# Patient Record
Sex: Female | Born: 1980 | Race: Black or African American | Hispanic: No | Marital: Single | Smoking: Current every day smoker
Health system: Southern US, Community
[De-identification: ages and names within clinical notes are randomized; demographics above are authoritative.]

## PROBLEM LIST (undated history)

## (undated) ENCOUNTER — Emergency Department (HOSPITAL_COMMUNITY): Admission: EM | Disposition: A | Discharge status: Home / Self Care | Payer: Medicaid Other

## (undated) DIAGNOSIS — T4145XA Adverse effect of unspecified anesthetic, initial encounter: Secondary | ICD-10-CM

## (undated) DIAGNOSIS — F32A Depression, unspecified: Secondary | ICD-10-CM

## (undated) DIAGNOSIS — E7800 Pure hypercholesterolemia, unspecified: Secondary | ICD-10-CM

## (undated) DIAGNOSIS — F17200 Nicotine dependence, unspecified, uncomplicated: Secondary | ICD-10-CM

## (undated) DIAGNOSIS — G8929 Other chronic pain: Secondary | ICD-10-CM

## (undated) DIAGNOSIS — F329 Major depressive disorder, single episode, unspecified: Secondary | ICD-10-CM

## (undated) DIAGNOSIS — K219 Gastro-esophageal reflux disease without esophagitis: Secondary | ICD-10-CM

## (undated) DIAGNOSIS — T8859XA Other complications of anesthesia, initial encounter: Secondary | ICD-10-CM

## (undated) DIAGNOSIS — M542 Cervicalgia: Secondary | ICD-10-CM

## (undated) DIAGNOSIS — M549 Dorsalgia, unspecified: Principal | ICD-10-CM

## (undated) DIAGNOSIS — E669 Obesity, unspecified: Secondary | ICD-10-CM

## (undated) DIAGNOSIS — A749 Chlamydial infection, unspecified: Secondary | ICD-10-CM

## (undated) DIAGNOSIS — F1910 Other psychoactive substance abuse, uncomplicated: Secondary | ICD-10-CM

## (undated) DIAGNOSIS — K8020 Calculus of gallbladder without cholecystitis without obstruction: Secondary | ICD-10-CM

## (undated) HISTORY — DX: Nicotine dependence, unspecified, uncomplicated: F17.200

## (undated) HISTORY — DX: Pure hypercholesterolemia, unspecified: E78.00

## (undated) HISTORY — DX: Other chronic pain: G89.29

## (undated) HISTORY — DX: Dorsalgia, unspecified: M54.9

## (undated) HISTORY — DX: Morbid (severe) obesity due to excess calories: E66.01

## (undated) HISTORY — DX: Other psychoactive substance abuse, uncomplicated: F19.10

## (undated) HISTORY — DX: Obesity, unspecified: E66.9

## (undated) HISTORY — PX: OTHER SURGICAL HISTORY: SHX169

## (undated) HISTORY — DX: Chlamydial infection, unspecified: A74.9

## (undated) HISTORY — DX: Gastro-esophageal reflux disease without esophagitis: K21.9

## (undated) HISTORY — DX: Depression, unspecified: F32.A

## (undated) HISTORY — PX: TUBAL LIGATION: SHX77

---
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
## 2000-01-13 ENCOUNTER — Inpatient Hospital Stay (HOSPITAL_COMMUNITY): Admission: AD | Admit: 2000-01-13 | Discharge: 2000-01-13 | Payer: Self-pay | Admitting: *Deleted

## 2000-01-13 ENCOUNTER — Encounter: Payer: Self-pay | Admitting: Obstetrics

## 2001-08-01 ENCOUNTER — Inpatient Hospital Stay (HOSPITAL_COMMUNITY): Admission: AD | Admit: 2001-08-01 | Discharge: 2001-08-01 | Payer: Self-pay | Admitting: *Deleted

## 2001-08-02 ENCOUNTER — Encounter: Payer: Self-pay | Admitting: *Deleted

## 2001-08-02 ENCOUNTER — Inpatient Hospital Stay (HOSPITAL_COMMUNITY): Admission: AD | Admit: 2001-08-02 | Discharge: 2001-08-02 | Payer: Self-pay | Admitting: Obstetrics and Gynecology

## 2001-08-02 ENCOUNTER — Observation Stay (HOSPITAL_COMMUNITY): Admission: AD | Admit: 2001-08-02 | Discharge: 2001-08-03 | Payer: Self-pay | Admitting: Obstetrics and Gynecology

## 2001-08-15 ENCOUNTER — Encounter (HOSPITAL_COMMUNITY): Admission: AD | Admit: 2001-08-15 | Discharge: 2001-08-15 | Payer: Self-pay | Admitting: *Deleted

## 2001-08-23 ENCOUNTER — Inpatient Hospital Stay (HOSPITAL_COMMUNITY): Admission: AD | Admit: 2001-08-23 | Discharge: 2001-08-23 | Payer: Self-pay | Admitting: Obstetrics and Gynecology

## 2001-08-28 ENCOUNTER — Inpatient Hospital Stay (HOSPITAL_COMMUNITY): Admission: AD | Admit: 2001-08-28 | Discharge: 2001-08-29 | Payer: Self-pay | Admitting: Obstetrics and Gynecology

## 2003-06-28 ENCOUNTER — Emergency Department (HOSPITAL_COMMUNITY): Admission: EM | Admit: 2003-06-28 | Discharge: 2003-06-28 | Payer: Self-pay | Admitting: Emergency Medicine

## 2003-10-25 ENCOUNTER — Emergency Department (HOSPITAL_COMMUNITY): Admission: EM | Admit: 2003-10-25 | Discharge: 2003-10-25 | Payer: Self-pay

## 2004-03-15 ENCOUNTER — Emergency Department (HOSPITAL_COMMUNITY): Admission: EM | Admit: 2004-03-15 | Discharge: 2004-03-15 | Payer: Self-pay

## 2004-07-20 ENCOUNTER — Ambulatory Visit: Payer: Self-pay | Admitting: Obstetrics and Gynecology

## 2004-07-20 ENCOUNTER — Inpatient Hospital Stay (HOSPITAL_COMMUNITY): Admission: AD | Admit: 2004-07-20 | Discharge: 2004-07-23 | Payer: Self-pay | Admitting: Family Medicine

## 2004-07-22 ENCOUNTER — Encounter (INDEPENDENT_AMBULATORY_CARE_PROVIDER_SITE_OTHER): Payer: Self-pay | Admitting: *Deleted

## 2004-07-29 ENCOUNTER — Inpatient Hospital Stay (HOSPITAL_COMMUNITY): Admission: AD | Admit: 2004-07-29 | Discharge: 2004-07-29 | Payer: Self-pay | Admitting: Family Medicine

## 2006-08-04 ENCOUNTER — Emergency Department (HOSPITAL_COMMUNITY): Admission: EM | Admit: 2006-08-04 | Discharge: 2006-08-04 | Payer: Self-pay | Admitting: Emergency Medicine

## 2006-08-27 ENCOUNTER — Emergency Department (HOSPITAL_COMMUNITY): Admission: EM | Admit: 2006-08-27 | Discharge: 2006-08-27 | Payer: Self-pay | Admitting: Family Medicine

## 2007-06-19 ENCOUNTER — Emergency Department (HOSPITAL_COMMUNITY): Admission: EM | Admit: 2007-06-19 | Discharge: 2007-06-19 | Payer: Self-pay | Admitting: Emergency Medicine

## 2007-06-20 ENCOUNTER — Emergency Department (HOSPITAL_COMMUNITY): Admission: EM | Admit: 2007-06-20 | Discharge: 2007-06-20 | Payer: Self-pay | Admitting: Emergency Medicine

## 2007-06-21 ENCOUNTER — Emergency Department (HOSPITAL_COMMUNITY): Admission: EM | Admit: 2007-06-21 | Discharge: 2007-06-21 | Payer: Self-pay | Admitting: Family Medicine

## 2007-11-22 ENCOUNTER — Emergency Department (HOSPITAL_COMMUNITY): Admission: EM | Admit: 2007-11-22 | Discharge: 2007-11-22 | Payer: Self-pay | Admitting: Emergency Medicine

## 2009-02-23 ENCOUNTER — Emergency Department (HOSPITAL_COMMUNITY): Admission: EM | Admit: 2009-02-23 | Discharge: 2009-02-23 | Payer: Self-pay | Admitting: Emergency Medicine

## 2009-04-12 ENCOUNTER — Emergency Department (HOSPITAL_COMMUNITY): Admission: EM | Admit: 2009-04-12 | Discharge: 2009-04-13 | Payer: Self-pay | Admitting: Emergency Medicine

## 2009-12-01 ENCOUNTER — Emergency Department (HOSPITAL_COMMUNITY): Admission: EM | Admit: 2009-12-01 | Discharge: 2009-12-01 | Payer: Self-pay | Admitting: Family Medicine

## 2009-12-01 ENCOUNTER — Inpatient Hospital Stay (HOSPITAL_COMMUNITY): Admission: AD | Admit: 2009-12-01 | Discharge: 2009-12-01 | Payer: Self-pay | Admitting: Obstetrics and Gynecology

## 2010-02-26 ENCOUNTER — Inpatient Hospital Stay (HOSPITAL_COMMUNITY): Admission: AD | Admit: 2010-02-26 | Discharge: 2010-02-27 | Payer: Self-pay | Admitting: Obstetrics and Gynecology

## 2010-03-31 ENCOUNTER — Inpatient Hospital Stay (HOSPITAL_COMMUNITY)
Admission: AD | Admit: 2010-03-31 | Discharge: 2010-03-31 | Payer: Self-pay | Source: Home / Self Care | Attending: Obstetrics & Gynecology | Admitting: Obstetrics & Gynecology

## 2010-05-05 ENCOUNTER — Encounter: Payer: Self-pay | Admitting: Obstetrics & Gynecology

## 2010-05-05 DIAGNOSIS — O0930 Supervision of pregnancy with insufficient antenatal care, unspecified trimester: Secondary | ICD-10-CM

## 2010-05-05 DIAGNOSIS — O99330 Smoking (tobacco) complicating pregnancy, unspecified trimester: Secondary | ICD-10-CM

## 2010-05-05 DIAGNOSIS — O358XX0 Maternal care for other (suspected) fetal abnormality and damage, not applicable or unspecified: Secondary | ICD-10-CM

## 2010-05-05 LAB — CONVERTED CEMR LAB
Antibody Screen: NEGATIVE
Basophils Absolute: 0 10*3/uL (ref 0.0–0.1)
Basophils Relative: 0 % (ref 0–1)
Eosinophils Absolute: 0.2 10*3/uL (ref 0.0–0.7)
Eosinophils Relative: 2 % (ref 0–5)
HCT: 37 % (ref 36.0–46.0)
HIV: NONREACTIVE
Hemoglobin: 12.8 g/dL (ref 12.0–15.0)
Hepatitis B Surface Ag: NEGATIVE
Lymphocytes Relative: 21 % (ref 12–46)
Lymphs Abs: 2 10*3/uL (ref 0.7–4.0)
MCHC: 34.6 g/dL (ref 30.0–36.0)
MCV: 90.9 fL (ref 78.0–100.0)
Monocytes Absolute: 0.6 10*3/uL (ref 0.1–1.0)
Monocytes Relative: 6 % (ref 3–12)
Neutro Abs: 6.6 10*3/uL (ref 1.7–7.7)
Neutrophils Relative %: 70 % (ref 43–77)
Platelets: 313 10*3/uL (ref 150–400)
RBC: 4.07 M/uL (ref 3.87–5.11)
RDW: 13.9 % (ref 11.5–15.5)
Rh Type: POSITIVE
Rubella: 28.2 intl units/mL — ABNORMAL HIGH
WBC: 9.4 10*3/uL (ref 4.0–10.5)

## 2010-05-06 ENCOUNTER — Other Ambulatory Visit: Payer: Self-pay | Admitting: Obstetrics and Gynecology

## 2010-05-06 ENCOUNTER — Other Ambulatory Visit: Payer: Self-pay

## 2010-05-06 DIAGNOSIS — O0930 Supervision of pregnancy with insufficient antenatal care, unspecified trimester: Secondary | ICD-10-CM

## 2010-05-06 DIAGNOSIS — O358XX0 Maternal care for other (suspected) fetal abnormality and damage, not applicable or unspecified: Secondary | ICD-10-CM

## 2010-05-06 LAB — POCT URINALYSIS DIPSTICK
Ketones, ur: NEGATIVE mg/dL
Nitrite: NEGATIVE
Protein, ur: NEGATIVE mg/dL
Specific Gravity, Urine: >=1.03 (ref 1.005–1.030)
Urine Glucose, Fasting: NEGATIVE mg/dL
Urobilinogen, UA: 2 mg/dL — ABNORMAL HIGH (ref 0.0–1.0)
pH: 6 (ref 5.0–8.0)

## 2010-06-03 ENCOUNTER — Encounter: Payer: Self-pay | Admitting: Family

## 2010-06-03 ENCOUNTER — Other Ambulatory Visit: Payer: Self-pay

## 2010-06-03 ENCOUNTER — Encounter: Payer: Self-pay | Admitting: Obstetrics and Gynecology

## 2010-06-03 DIAGNOSIS — O99330 Smoking (tobacco) complicating pregnancy, unspecified trimester: Secondary | ICD-10-CM

## 2010-06-03 DIAGNOSIS — O0930 Supervision of pregnancy with insufficient antenatal care, unspecified trimester: Secondary | ICD-10-CM

## 2010-06-03 LAB — POCT URINALYSIS DIPSTICK
Bilirubin Urine: NEGATIVE
Glucose, UA: NEGATIVE mg/dL
Ketones, ur: NEGATIVE mg/dL
Nitrite: NEGATIVE
Protein, ur: NEGATIVE mg/dL
Specific Gravity, Urine: 1.02 (ref 1.005–1.030)
Urobilinogen, UA: 0.2 mg/dL (ref 0.0–1.0)
pH: 6.5 (ref 5.0–8.0)

## 2010-06-08 LAB — CBC
HCT: 36.2 % (ref 36.0–46.0)
Hemoglobin: 12.4 g/dL (ref 12.0–15.0)
MCH: 29.9 pg (ref 26.0–34.0)
MCHC: 34.3 g/dL (ref 30.0–36.0)
MCV: 87.2 fL (ref 78.0–100.0)
Platelets: 237 10*3/uL (ref 150–400)
RBC: 4.15 MIL/uL (ref 3.87–5.11)
RDW: 13.6 % (ref 11.5–15.5)
WBC: 10 10*3/uL (ref 4.0–10.5)

## 2010-06-08 LAB — DIFFERENTIAL
Basophils Absolute: 0 10*3/uL (ref 0.0–0.1)
Basophils Relative: 0 % (ref 0–1)
Eosinophils Absolute: 0.2 10*3/uL (ref 0.0–0.7)
Eosinophils Relative: 2 % (ref 0–5)
Lymphocytes Relative: 20 % (ref 12–46)
Lymphs Abs: 2 10*3/uL (ref 0.7–4.0)
Monocytes Absolute: 0.6 10*3/uL (ref 0.1–1.0)
Monocytes Relative: 6 % (ref 3–12)
Neutro Abs: 7.1 10*3/uL (ref 1.7–7.7)
Neutrophils Relative %: 72 % (ref 43–77)

## 2010-06-08 LAB — URINALYSIS, ROUTINE W REFLEX MICROSCOPIC
Bilirubin Urine: NEGATIVE
Bilirubin Urine: NEGATIVE
Glucose, UA: NEGATIVE mg/dL
Glucose, UA: NEGATIVE mg/dL
Hgb urine dipstick: NEGATIVE
Hgb urine dipstick: NEGATIVE
Ketones, ur: 15 mg/dL — AB
Ketones, ur: 15 mg/dL — AB
Nitrite: NEGATIVE
Nitrite: NEGATIVE
Protein, ur: NEGATIVE mg/dL
Protein, ur: NEGATIVE mg/dL
Specific Gravity, Urine: >1.03 — ABNORMAL HIGH (ref 1.005–1.030)
Specific Gravity, Urine: >1.03 — ABNORMAL HIGH (ref 1.005–1.030)
Urobilinogen, UA: 1 mg/dL (ref 0.0–1.0)
Urobilinogen, UA: 2 mg/dL — ABNORMAL HIGH (ref 0.0–1.0)
pH: 6 (ref 5.0–8.0)
pH: 6 (ref 5.0–8.0)

## 2010-06-08 LAB — URINE MICROSCOPIC-ADD ON

## 2010-06-08 LAB — WET PREP, GENITAL
Trich, Wet Prep: NONE SEEN
Yeast Wet Prep HPF POC: NONE SEEN

## 2010-06-08 LAB — RPR: RPR Ser Ql: NONREACTIVE

## 2010-06-08 LAB — RAPID URINE DRUG SCREEN, HOSP PERFORMED
Amphetamines: NOT DETECTED
Barbiturates: NOT DETECTED
Benzodiazepines: NOT DETECTED
Cocaine: NOT DETECTED
Opiates: NOT DETECTED
Tetrahydrocannabinol: POSITIVE — AB

## 2010-06-08 LAB — SICKLE CELL SCREEN: Sickle Cell Screen: NEGATIVE

## 2010-06-08 LAB — HIV ANTIBODY (ROUTINE TESTING W REFLEX): HIV: NONREACTIVE

## 2010-06-08 LAB — HEPATITIS B SURFACE ANTIGEN: Hepatitis B Surface Ag: NEGATIVE

## 2010-06-08 LAB — ABO/RH: ABO/RH(D): O POS

## 2010-06-08 LAB — RUBELLA SCREEN: Rubella: 28.7 IU/mL — ABNORMAL HIGH

## 2010-06-11 LAB — POCT URINALYSIS DIPSTICK
Protein, ur: 30 mg/dL — AB
Specific Gravity, Urine: >=1.03 (ref 1.005–1.030)
pH: 5.5 (ref 5.0–8.0)

## 2010-06-11 LAB — URINE CULTURE: Colony Count: 40000

## 2010-06-11 LAB — POCT I-STAT, CHEM 8
BUN: 4 mg/dL — ABNORMAL LOW (ref 6–23)
Creatinine, Ser: 0.5 mg/dL (ref 0.4–1.2)
Hemoglobin: 15 g/dL (ref 12.0–15.0)
Potassium: 3.7 mEq/L (ref 3.5–5.1)
Sodium: 137 mEq/L (ref 135–145)

## 2010-06-11 LAB — GC/CHLAMYDIA PROBE AMP, GENITAL: Chlamydia, DNA Probe: NEGATIVE

## 2010-06-11 LAB — WET PREP, GENITAL
WBC, Wet Prep HPF POC: NONE SEEN
Yeast Wet Prep HPF POC: NONE SEEN

## 2010-06-24 ENCOUNTER — Other Ambulatory Visit: Payer: Self-pay | Admitting: Obstetrics and Gynecology

## 2010-06-24 DIAGNOSIS — O0930 Supervision of pregnancy with insufficient antenatal care, unspecified trimester: Secondary | ICD-10-CM

## 2010-06-24 DIAGNOSIS — Z331 Pregnant state, incidental: Secondary | ICD-10-CM

## 2010-06-24 DIAGNOSIS — O358XX0 Maternal care for other (suspected) fetal abnormality and damage, not applicable or unspecified: Secondary | ICD-10-CM

## 2010-06-24 LAB — POCT URINALYSIS DIP (DEVICE)
Bilirubin Urine: NEGATIVE
Glucose, UA: NEGATIVE mg/dL
Ketones, ur: NEGATIVE mg/dL
pH: 6 (ref 5.0–8.0)

## 2010-07-06 ENCOUNTER — Other Ambulatory Visit: Payer: Self-pay | Admitting: Obstetrics and Gynecology

## 2010-07-06 DIAGNOSIS — O99330 Smoking (tobacco) complicating pregnancy, unspecified trimester: Secondary | ICD-10-CM

## 2010-07-06 DIAGNOSIS — O0930 Supervision of pregnancy with insufficient antenatal care, unspecified trimester: Secondary | ICD-10-CM

## 2010-07-06 LAB — POCT URINALYSIS DIP (DEVICE)
Ketones, ur: NEGATIVE mg/dL
Protein, ur: NEGATIVE mg/dL
Specific Gravity, Urine: >=1.03 (ref 1.005–1.030)

## 2010-07-08 ENCOUNTER — Inpatient Hospital Stay (HOSPITAL_COMMUNITY)
Admission: RE | Admit: 2010-07-08 | Discharge: 2010-07-09 | DRG: 775 | Disposition: A | Discharge status: Home / Self Care | Payer: Medicaid Other | Source: Ambulatory Visit | Attending: Obstetrics & Gynecology | Admitting: Obstetrics & Gynecology

## 2010-07-08 DIAGNOSIS — O480 Post-term pregnancy: Secondary | ICD-10-CM

## 2010-07-08 LAB — CBC
HCT: 37 % (ref 36.0–46.0)
Hemoglobin: 12.4 g/dL (ref 12.0–15.0)
MCV: 89.8 fL (ref 78.0–100.0)
RBC: 4.12 MIL/uL (ref 3.87–5.11)
RDW: 13.8 % (ref 11.5–15.5)
WBC: 8 10*3/uL (ref 4.0–10.5)

## 2010-08-14 NOTE — Op Note (Signed)
NAMETERICKA, DEVINCENZI                  ACCOUNT NO.:  [id number]   MEDICAL RECORD NO.:  [id number]          PATIENT TYPE:  INP   LOCATION:  9124                          FACILITY:  [facility]   PHYSICIAN:  Jon Gills, M.D.  DATE OF BIRTH:  1980/04/21   DATE OF PROCEDURE:  07/22/2004  DATE OF DISCHARGE:                                 OPERATIVE REPORT   PREOPERATIVE DIAGNOSIS:  1.  Multiparity.  2.  Desired sterilization.   POSTOPERATIVE DIAGNOSIS:  1.  Multiparity.  2.  Desired sterilization.   PROCEDURE:  Postpartum tubal ligation, Pomeroy method.   SURGEON:  Javier Glazier. Okey Dupre, M.D.   ASSISTANT:  Jon Gills, M.D.   ANESTHESIA:  Epidural, 0.5% Marcaine for local anesthesia.   COMPLICATIONS:  None.   ESTIMATED BLOOD LOSS:  Less than 200 mL.   INDICATIONS FOR PROCEDURE:  A 30 year old, gravida 4, para 4, status post  normal spontaneous vaginal delivery who desires permanent sterilization.  The risks and benefits of the procedure were discussed with the patient  including a risk failure of less than 1% with increased risk of ectopic  pregnancy if pregnancy does occur.   FINDINGS:  Normal uterus, tubes, and ovaries.   DESCRIPTION OF PROCEDURE:  The patient was taken to the operating room where  her epidural was found to be adequate.  A small transverse infraumbilical  skin incision was made with a scalpel.  The incision was carried down to the  underlying layer of fascia until the peritoneum was identified and entered.  The peritoneum was noted to be free of any adhesions and the incision was  extended with Metzenbaum scissors.   The patient's right fallopian tube was identified, brought to the incision,  and grasped with Babcock clamp.  The tube was followed out to the fimbria.  A 3 cm segment of tube was ligated with a free tie of plain gut and excised.  Good hemostasis was noted.  The ends were cauterized and the tube was  returned to the abdomen.  The left fallopian tube was  ligated and a 3 cm  segment was excised in a similar fashion.  The tube was cauterized on the  ends, excellent hemostasis was noted, and the tube was returned to the  abdomen.   The peritoneum and fascia were closed in two layers using 0 Vicryl.  The  skin was closed in subcuticular fashion.   The patient tolerated the procedure well.  Needle, sponge, and instrument  counts correct x2.  The patient was taken to the recovery room in stable  condition.   PATHOLOGY:  Portions of right and left tube.      LC/MEDQ  D:  07/22/2004  T:  07/23/2004  Job:  [id number]

## 2010-08-19 ENCOUNTER — Ambulatory Visit: Payer: Medicaid Other | Admitting: Family Medicine

## 2010-08-19 ENCOUNTER — Ambulatory Visit: Payer: Medicaid Other | Admitting: Obstetrics & Gynecology

## 2010-08-28 ENCOUNTER — Other Ambulatory Visit: Payer: Self-pay | Admitting: Specialist

## 2010-08-28 ENCOUNTER — Ambulatory Visit
Admission: RE | Admit: 2010-08-28 | Discharge: 2010-08-28 | Disposition: A | Discharge status: Home / Self Care | Payer: Medicaid Other | Source: Ambulatory Visit | Attending: Specialist | Admitting: Specialist

## 2010-08-28 DIAGNOSIS — M542 Cervicalgia: Secondary | ICD-10-CM

## 2010-08-28 DIAGNOSIS — R200 Anesthesia of skin: Secondary | ICD-10-CM

## 2010-09-03 ENCOUNTER — Ambulatory Visit: Payer: Medicaid Other | Admitting: Physician Assistant

## 2010-09-25 ENCOUNTER — Ambulatory Visit (INDEPENDENT_AMBULATORY_CARE_PROVIDER_SITE_OTHER): Payer: Medicaid Other | Admitting: Physician Assistant

## 2010-10-06 ENCOUNTER — Ambulatory Visit: Payer: Medicaid Other | Attending: Family Medicine | Admitting: Physical Therapy

## 2010-10-29 ENCOUNTER — Ambulatory Visit: Payer: Medicaid Other | Attending: Family Medicine | Admitting: Rehabilitative and Restorative Service Providers"

## 2010-10-29 DIAGNOSIS — M2550 Pain in unspecified joint: Secondary | ICD-10-CM | POA: Insufficient documentation

## 2010-10-29 DIAGNOSIS — M2569 Stiffness of other specified joint, not elsewhere classified: Secondary | ICD-10-CM | POA: Insufficient documentation

## 2010-10-29 DIAGNOSIS — IMO0001 Reserved for inherently not codable concepts without codable children: Secondary | ICD-10-CM | POA: Insufficient documentation

## 2010-11-10 ENCOUNTER — Ambulatory Visit: Payer: Medicaid Other | Admitting: Rehabilitative and Restorative Service Providers"

## 2010-11-17 ENCOUNTER — Encounter: Payer: Medicaid Other | Admitting: Rehabilitative and Restorative Service Providers"

## 2010-11-20 ENCOUNTER — Ambulatory Visit: Payer: Medicaid Other | Admitting: Family Medicine

## 2011-01-04 ENCOUNTER — Encounter: Payer: Self-pay | Admitting: *Deleted

## 2011-01-04 DIAGNOSIS — M549 Dorsalgia, unspecified: Secondary | ICD-10-CM

## 2011-01-04 DIAGNOSIS — Z8619 Personal history of other infectious and parasitic diseases: Secondary | ICD-10-CM

## 2011-01-04 DIAGNOSIS — E669 Obesity, unspecified: Secondary | ICD-10-CM

## 2011-01-04 DIAGNOSIS — F1910 Other psychoactive substance abuse, uncomplicated: Secondary | ICD-10-CM

## 2011-01-04 DIAGNOSIS — G8929 Other chronic pain: Secondary | ICD-10-CM

## 2011-01-04 DIAGNOSIS — F17200 Nicotine dependence, unspecified, uncomplicated: Secondary | ICD-10-CM | POA: Insufficient documentation

## 2011-01-04 HISTORY — DX: Personal history of other infectious and parasitic diseases: Z86.19

## 2011-01-06 ENCOUNTER — Ambulatory Visit: Payer: Medicaid Other | Admitting: Family Medicine

## 2011-03-06 ENCOUNTER — Emergency Department (HOSPITAL_COMMUNITY)
Admission: EM | Admit: 2011-03-06 | Discharge: 2011-03-06 | Disposition: A | Discharge status: Home / Self Care | Payer: Medicaid Other | Attending: Emergency Medicine | Admitting: Emergency Medicine

## 2011-03-06 ENCOUNTER — Encounter (HOSPITAL_COMMUNITY): Payer: Self-pay | Admitting: *Deleted

## 2011-03-06 ENCOUNTER — Emergency Department (HOSPITAL_COMMUNITY): Payer: Medicaid Other

## 2011-03-06 DIAGNOSIS — S139XXA Sprain of joints and ligaments of unspecified parts of neck, initial encounter: Secondary | ICD-10-CM | POA: Insufficient documentation

## 2011-03-06 DIAGNOSIS — S161XXA Strain of muscle, fascia and tendon at neck level, initial encounter: Secondary | ICD-10-CM

## 2011-03-06 DIAGNOSIS — R51 Headache: Secondary | ICD-10-CM

## 2011-03-06 DIAGNOSIS — F17200 Nicotine dependence, unspecified, uncomplicated: Secondary | ICD-10-CM | POA: Insufficient documentation

## 2011-03-06 DIAGNOSIS — R109 Unspecified abdominal pain: Secondary | ICD-10-CM | POA: Insufficient documentation

## 2011-03-06 DIAGNOSIS — M542 Cervicalgia: Secondary | ICD-10-CM | POA: Insufficient documentation

## 2011-03-06 DIAGNOSIS — R6884 Jaw pain: Secondary | ICD-10-CM | POA: Insufficient documentation

## 2011-03-06 LAB — CBC
HCT: 38 % (ref 36.0–46.0)
Hemoglobin: 12.8 g/dL (ref 12.0–15.0)
MCH: 29 pg (ref 26.0–34.0)
MCV: 86 fL (ref 78.0–100.0)
RBC: 4.42 MIL/uL (ref 3.87–5.11)
WBC: 6.8 10*3/uL (ref 4.0–10.5)

## 2011-03-06 LAB — BASIC METABOLIC PANEL
BUN: 7 mg/dL (ref 6–23)
CO2: 25 mEq/L (ref 19–32)
Calcium: 9.4 mg/dL (ref 8.4–10.5)
Chloride: 105 mEq/L (ref 96–112)
Creatinine, Ser: 0.61 mg/dL (ref 0.50–1.10)
Glucose, Bld: 86 mg/dL (ref 70–99)

## 2011-03-06 MED ORDER — HYDROMORPHONE HCL 2 MG PO TABS: 2.0000 mg | ORAL_TABLET | ORAL | Status: AC | PRN

## 2011-03-06 MED ORDER — SODIUM CHLORIDE 0.9 % IV SOLN
INTRAVENOUS | Status: DC
  Administered 2011-03-06: 100 mL via INTRAVENOUS

## 2011-03-06 MED ORDER — HYDROMORPHONE HCL PF 1 MG/ML IJ SOLN
1.0000 mg | Freq: Once | INTRAMUSCULAR | Status: AC
  Administered 2011-03-06: 1 mg via INTRAVENOUS
  Filled 2011-03-06: qty 1

## 2011-03-06 MED ORDER — ONDANSETRON HCL 4 MG/2ML IJ SOLN
4.0000 mg | Freq: Once | INTRAMUSCULAR | Status: AC
  Administered 2011-03-06: 4 mg via INTRAVENOUS
  Filled 2011-03-06: qty 2

## 2011-03-06 MED ORDER — IOHEXOL 300 MG/ML  SOLN
125.0000 mL | Freq: Once | INTRAMUSCULAR | Status: AC | PRN
  Administered 2011-03-06: 125 mL via INTRAVENOUS

## 2011-03-06 MED ORDER — NAPROXEN 500 MG PO TABS: 500.0000 mg | ORAL_TABLET | Freq: Two times a day (BID) | ORAL | Status: DC

## 2011-03-06 MED ORDER — PROMETHAZINE HCL 25 MG PO TABS: 25.0000 mg | ORAL_TABLET | Freq: Four times a day (QID) | ORAL | Status: AC | PRN
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
## 2011-03-08 ENCOUNTER — Encounter (HOSPITAL_COMMUNITY): Payer: Self-pay | Admitting: Emergency Medicine

## 2011-03-08 ENCOUNTER — Emergency Department (HOSPITAL_COMMUNITY)
Admission: EM | Admit: 2011-03-08 | Discharge: 2011-03-08 | Disposition: A | Discharge status: Home / Self Care | Payer: Medicaid Other | Attending: Emergency Medicine | Admitting: Emergency Medicine

## 2011-03-08 DIAGNOSIS — M549 Dorsalgia, unspecified: Secondary | ICD-10-CM

## 2011-03-08 DIAGNOSIS — R599 Enlarged lymph nodes, unspecified: Secondary | ICD-10-CM | POA: Insufficient documentation

## 2011-03-08 DIAGNOSIS — R109 Unspecified abdominal pain: Secondary | ICD-10-CM | POA: Insufficient documentation

## 2011-03-08 LAB — URINALYSIS, ROUTINE W REFLEX MICROSCOPIC
Nitrite: NEGATIVE
Protein, ur: NEGATIVE mg/dL
Urobilinogen, UA: 1 mg/dL (ref 0.0–1.0)

## 2011-03-08 LAB — URINE MICROSCOPIC-ADD ON

## 2011-03-08 MED ORDER — IBUPROFEN 800 MG PO TABS: 800.0000 mg | ORAL_TABLET | Freq: Three times a day (TID) | ORAL | Status: AC

## 2011-03-08 MED ORDER — HYDROMORPHONE HCL PF 2 MG/ML IJ SOLN
2.0000 mg | Freq: Once | INTRAMUSCULAR | Status: AC
  Administered 2011-03-08: 2 mg via INTRAMUSCULAR
  Filled 2011-03-08: qty 1

## 2011-03-08 NOTE — ED Notes (Signed)
Pt states was seen Friday, states pain is worse, c/o abd and back pain. States medication is not helping.
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
## 2011-05-17 ENCOUNTER — Emergency Department (HOSPITAL_COMMUNITY)
Admission: EM | Admit: 2011-05-17 | Discharge: 2011-05-17 | Disposition: A | Discharge status: Home / Self Care | Payer: Medicaid Other | Source: Home / Self Care | Attending: Family Medicine | Admitting: Family Medicine

## 2011-05-17 ENCOUNTER — Encounter (HOSPITAL_COMMUNITY): Payer: Self-pay | Admitting: Cardiology

## 2011-05-17 DIAGNOSIS — J069 Acute upper respiratory infection, unspecified: Secondary | ICD-10-CM

## 2011-05-17 MED ORDER — TRAMADOL HCL 50 MG PO TABS: 50.0000 mg | ORAL_TABLET | Freq: Four times a day (QID) | ORAL | Status: AC | PRN

## 2011-05-17 MED ORDER — ALBUTEROL SULFATE HFA 108 (90 BASE) MCG/ACT IN AERS: 1.0000 | INHALATION_SPRAY | Freq: Four times a day (QID) | RESPIRATORY_TRACT | Status: DC | PRN

## 2011-05-17 MED ORDER — IBUPROFEN 600 MG PO TABS: 600.0000 mg | ORAL_TABLET | Freq: Three times a day (TID) | ORAL | Status: AC | PRN

## 2011-05-17 NOTE — Discharge Instructions (Signed)
You need to quit smoking! Is very important top keep well hydrated. Take the prescribed medications as instructed. Can take ibuprofen scheduled every 8 hours, take with food and plenty of liquids as it can upset your stomach, can alternate with tylenol every 6 hours as needed for pain or fever. Use nasal saline spray at least 3 times a day. (simply saline is over the counter) Return if difficulty breathing or not keeping fluids down.

## 2011-05-17 NOTE — ED Provider Notes (Signed)
History     CSN: [id number]  Arrival date & time 05/17/11  7829   First MD Initiated Contact with Patient 05/17/11 1905      Chief Complaint  Patient presents with  . Nasal Congestion  . Cough  . Sore Throat  . Back Pain    (Consider location/radiation/quality/duration/timing/severity/associated sxs/prior treatment) HPI Comments: 31 y/o female smoker. Here c/o nasal congestion, rhinorrhea, cough non productive and wheezing for 3 days. Ran out her albuterol.  No fever, no chest pain or shortness of breath.. Children with similar symptoms. Has body aches and exacerbation of her chronic back pain with coughing. Good appetite. No nausea vomiting or diarrhea.   Past Medical History  Diagnosis Date  . Chronic back pain   . Drug abuse     marijuana  . Chlamydia   . Smoker   . Obesity     Past Surgical History  Procedure Date  . Btl     No family history on file.  History  Substance Use Topics  . Smoking status: Current Everyday Smoker -- 0.2 packs/day    Types: Cigarettes  . Smokeless tobacco: Not on file  . Alcohol Use: No    OB History    Grav Para Term Preterm Abortions TAB SAB Ect Mult Living   5 5 5       5       Review of Systems  Constitutional: Negative for fever, chills and appetite change.  HENT: Positive for congestion, sore throat and rhinorrhea. Negative for trouble swallowing.   Respiratory: Positive for cough and wheezing. Negative for shortness of breath.   Gastrointestinal: Negative for nausea, vomiting and abdominal pain.  Musculoskeletal: Positive for back pain.  Skin: Negative for rash.    Allergies  Hydrocodone  Home Medications   Current Outpatient Rx  Name Route Sig Dispense Refill  . NAPROXEN 500 MG PO TABS Oral Take 1 tablet (500 mg total) by mouth 2 (two) times daily. 30 tablet 0  . ALBUTEROL SULFATE HFA 108 (90 BASE) MCG/ACT IN AERS Inhalation Inhale 1-2 puffs into the lungs every 6 (six) hours as needed for wheezing. 1 Inhaler 0   . IBUPROFEN 600 MG PO TABS Oral Take 1 tablet (600 mg total) by mouth every 8 (eight) hours as needed for pain. 20 tablet 0  . TRAMADOL HCL 50 MG PO TABS Oral Take 1 tablet (50 mg total) by mouth every 6 (six) hours as needed for pain. 15 tablet 0    BP 105/71  Pulse 71  Temp(Src) 98.5 F (36.9 C) (Oral)  Resp 18  SpO2 100%  LMP 05/17/2011  Breastfeeding? No  Physical Exam  Nursing note and vitals reviewed. Constitutional: She is oriented to person, place, and time. She appears well-developed and well-nourished. No distress.  HENT:       Nasal Congestion with erythema and swelling of nasal turbinates, clear rhinorrhea. Mild pharyngeal erythema no exudates. No uvula deviation. No trismus. TM's normal  Eyes: Conjunctivae are normal. Pupils are equal, round, and reactive to light.  Neck: No JVD present.  Cardiovascular: Normal rate, regular rhythm and normal heart sounds.   Pulmonary/Chest: Effort normal and breath sounds normal. No respiratory distress. She has no wheezes. She has no rales. She exhibits no tenderness.  Lymphadenopathy:    She has no cervical adenopathy.  Neurological: She is alert and oriented to person, place, and time.  Skin: No rash noted.    ED Course  Procedures (including critical care time)  Labs  Reviewed - No data to display No results found.   1. URI (upper respiratory infection)       MDM  Refilled albuterol plus symptomatic treatment advised.        Sharin Grave, MD 05/19/11 1433

## 2011-05-17 NOTE — ED Notes (Signed)
Pt report nasal congestion, sore throat, and cough that started yesterday. Denies fever. Pt also reports mid back pain going down right arm that started after having epidural for childbirth. Tolerating PO intake.

## 2011-07-19 ENCOUNTER — Inpatient Hospital Stay (HOSPITAL_COMMUNITY)
Admission: AD | Admit: 2011-07-19 | Discharge: 2011-07-20 | Disposition: A | Discharge status: Home / Self Care | Payer: Medicaid Other | Source: Ambulatory Visit | Attending: Emergency Medicine | Admitting: Emergency Medicine

## 2011-07-19 ENCOUNTER — Encounter (HOSPITAL_COMMUNITY): Payer: Self-pay | Admitting: *Deleted

## 2011-07-19 DIAGNOSIS — M549 Dorsalgia, unspecified: Secondary | ICD-10-CM | POA: Insufficient documentation

## 2011-07-19 DIAGNOSIS — R51 Headache: Secondary | ICD-10-CM

## 2011-07-19 DIAGNOSIS — G8929 Other chronic pain: Secondary | ICD-10-CM

## 2011-07-19 DIAGNOSIS — R29898 Other symptoms and signs involving the musculoskeletal system: Secondary | ICD-10-CM | POA: Insufficient documentation

## 2011-07-19 HISTORY — DX: Other complications of anesthesia, initial encounter: T88.59XA

## 2011-07-19 HISTORY — DX: Adverse effect of unspecified anesthetic, initial encounter: T41.45XA

## 2011-07-19 LAB — POCT PREGNANCY, URINE: Preg Test, Ur: NEGATIVE

## 2011-07-19 MED ORDER — METOCLOPRAMIDE HCL 5 MG/ML IJ SOLN
10.0000 mg | Freq: Once | INTRAMUSCULAR | Status: AC
  Administered 2011-07-19: 10 mg via INTRAVENOUS
  Filled 2011-07-19: qty 2

## 2011-07-19 MED ORDER — DIPHENHYDRAMINE HCL 50 MG/ML IJ SOLN
25.0000 mg | Freq: Once | INTRAMUSCULAR | Status: AC
  Administered 2011-07-19: 25 mg via INTRAVENOUS
  Filled 2011-07-19: qty 1

## 2011-07-19 MED ORDER — HYDROMORPHONE HCL PF 1 MG/ML IJ SOLN
1.0000 mg | Freq: Once | INTRAMUSCULAR | Status: AC
  Administered 2011-07-19: 1 mg via INTRAVENOUS
  Filled 2011-07-19: qty 1

## 2011-07-19 MED ORDER — LORAZEPAM 2 MG/ML IJ SOLN
1.0000 mg | Freq: Once | INTRAMUSCULAR | Status: AC
  Administered 2011-07-19: 1 mg via INTRAVENOUS
  Filled 2011-07-19: qty 1

## 2011-07-19 NOTE — MAU Provider Note (Signed)
History     CSN: [id number]  Arrival date and time: 07/19/11 1646   First Provider Initiated Contact with Patient 07/19/11 1734      Chief Complaint  Patient presents with  . Back, neck, arm and spine pain    HPI Vanessa Donaldson is 31 y.o. [id number] Unknown weeks presenting with back pain with numbness in her right arm.  States she dropped a cup in our bathroom from weakness in her right arm.   States she has been to "every hospital and all they given me is pain medication".  Back itches and hurts so she scratches until it bleeds.  Delivered here last April and thinks the back pain is secondary to epidural, never had back pain until then. States she has back pain daily.  Came here because she has been everywhere else but it continues. Had tubal ligation and then got pregnancy 6 years later because " they took out the wrong organs". She has been using her now deceased grandmother's medication for pain but is now out.  She was wearing patches for pain.  Last patch used last week.  States she can't hold her baby.  LMP 07/19/11--expected.  Uses condoms for contraception.  Dr. Mayford Knife on [address] is setting up appt for IUD.     Past Medical History  Diagnosis Date  . Chronic back pain   . Drug abuse     marijuana  . Chlamydia   . Smoker   . Obesity   . Complication of anesthesia     perceived problem with epidural    Past Surgical History  Procedure Date  . Btl   . Tubal ligation     had a baby since BTL    History reviewed. No pertinent family history.  History  Substance Use Topics  . Smoking status: Current Everyday Smoker -- 0.2 packs/day    Types: Cigarettes  . Smokeless tobacco: Never Used  . Alcohol Use: No    Allergies:  Allergies  Allergen Reactions  . Hydrocodone Nausea Only and Other (See Comments)    Migraine headaches    Prescriptions prior to admission  Medication Sig Dispense Refill  . ibuprofen (ADVIL,MOTRIN) 800 MG tablet Take 800 mg by mouth every  8 (eight) hours as needed. For pain.      . naproxen (NAPROSYN) 500 MG tablet Take 1 tablet (500 mg total) by mouth 2 (two) times daily.  30 tablet  0    Review of Systems  Musculoskeletal: Positive for back pain.  Neurological: Positive for focal weakness (right arm/hand). Negative for dizziness, tingling and tremors.       Negative for slurred speech or unsteady gait.   Physical Exam   Blood pressure 112/64, pulse 64, temperature 98.9 F (37.2 C), temperature source Oral, resp. rate 22, height 5\' 2"  (1.575 m), weight 113.399 kg (250 lb), last menstrual period 07/19/2011, SpO2 100.00%.  Physical Exam  Constitutional: She is oriented to person, place, and time. She appears well-developed and well-nourished. She appears distressed (uncomfortable).  HENT:  Head: Normocephalic.  Cardiovascular: Normal rate.   Respiratory: Effort normal.  Musculoskeletal: She exhibits tenderness (mid-lower back with palpation).  Neurological: She is alert and oriented to person, place, and time. Coordination normal.  Skin: Skin is warm and dry.  Psychiatric: She has a normal mood and affect.    MAU Course  Procedures  MDM 17:55 Reported patient's presenting sxs and concern to Dr. Jolayne Panther.  Will transfer to [facility] for further  evaluation of back pain and weakness in her right arm.            Reported to Dr. Margarita Grizzle at [facility], she accepted transfer for further evaluation.  Patient informed of plan of care and she accepts.  Assessment and Plan  A:  1 year hx of back pain     Weakness in right arm  P:  Transfer to [facility] ED by Sherlie Ban 07/19/2011, 5:43 PM

## 2011-07-19 NOTE — MAU Note (Signed)
Transferred to [facility] via [facility] 
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
## 2011-07-19 NOTE — MAU Note (Addendum)
Patient in BR crying with pain. Went to assist patient to room. Patient doubled over crying with C/O pain all over her back.States she has had pain up and down her back, right arm is weak. Sometimes numbness, sometimes sharp shooting, variable. States it "cracks and pops" sometimes. States she has had this since she had her baby 1 year ago. Thinks it is from the epidural.  States when she was in the bathroom on arrival, her arm was so weak, she dropped the cup and got her clothing all wet. States her grandmother died recently and there were some left over pain patches and oxycodone, so she was using them. States she doesn't usually do that, but she was desparate. States the patches and the pills didn't help anyway. States she has been seen multiple times and has been given different medications, but has not received any relief and has not been given a diagnosis. States muscle relaxer does not help either. States she saw a physical therapist 1 time just for discussion. States she knows something is wrong.

## 2011-07-19 NOTE — ED Notes (Signed)
[id number]<AV> Expected date:<BR> Expected time:<BR> Means of arrival:<BR> Comments:<BR> [facility], back pain

## 2011-07-20 MED ORDER — METOCLOPRAMIDE HCL 10 MG PO TABS: 10.0000 mg | ORAL_TABLET | Freq: Four times a day (QID) | ORAL | Status: DC | PRN

## 2011-07-20 MED ORDER — OXYCODONE-ACETAMINOPHEN 5-325 MG PO TABS: 2.0000 | ORAL_TABLET | Freq: Three times a day (TID) | ORAL | Status: AC | PRN

## 2011-07-20 NOTE — Discharge Instructions (Signed)
You are having a headache. No specific cause was found today for your headache. It may have been a migraine or other cause of headache. Stress, anxiety, fatigue, and depression are common triggers for headaches. Your headache today does not appear to be life-threatening or require hospitalization, but often the exact cause of headaches is not determined in the emergency department. Therefore, follow-up with your doctor is very important to find out what may have caused your headache, and whether or not you need any further diagnostic testing or treatment. Sometimes headaches can appear benign (not harmful), but then more serious symptoms can develop which should prompt an immediate re-evaluation by your doctor or the emergency department. SEEK MEDICAL ATTENTION IF: You develop possible problems with medications prescribed.  The medications don't resolve your headache, if it recurs , or if you have multiple episodes of vomiting or can't take fluids. You have a change from the usual headache. RETURN IMMEDIATELY IF you develop a sudden, severe headache or confusion, become poorly responsive or faint, develop a fever above 100.80F or problem breathing, have a change in speech, vision, swallowing, or understanding, or develop new weakness, numbness, tingling, incoordination, or have a seizure.  You have neck pain, possibly from a cervical strain and/or pinched nerve.  SEEK IMMEDIATE MEDICAL ATTENTION IF: You develop difficulties swallowing or breathing.  You have new or worse numbness, weakness, tingling, or movement problems in your arms or legs.  You develop increasing pain which is uncontrolled with medications.  You have change in bowel or bladder function, or other concerns.  SEEK IMMEDIATE MEDICAL ATTENTION IF: New numbness, tingling, weakness, or problem with the use of your arms or legs.  Severe back pain not relieved with medications.  Change in bowel or bladder control.  Increasing pain in any  areas of the body (such as chest or abdominal pain).  Shortness of breath, dizziness or fainting.  Nausea (feeling sick to your stomach), vomiting, fever, or sweats.  Chronic Pain Discharge Instructions  Emergency care providers appreciate that many patients coming to [place] are in severe pain and we wish to address their pain in the safest, most responsible manner.  It is important to recognize however, that the proper treatment of chronic pain differs from that of the pain of injuries and acute illnesses.  Our goal is to provide quality, safe, personalized care and we thank you for giving [place] the opportunity to serve you. The use of narcotics and related agents for chronic pain syndromes may lead to additional physical and psychological problems.  Nearly as many people die from prescription narcotics each year as die from car crashes.  Additionally, this risk is increased if such prescriptions are obtained from a variety of sources.  Therefore, only your primary care physician or a pain management specialist is able to safely treat such syndromes with narcotic medications long-term.    Documentation revealing such prescriptions have been sought from multiple sources may prohibit [place] from providing a refill or different narcotic medication.  Your name may be checked first through the [facility] Controlled Substances Reporting System.  This database is a record of controlled substance medication prescriptions that the patient has received.  This has been established by [facility] in an effort to eliminate the dangerous, and often life threatening, practice of obtaining multiple prescriptions from different medical providers.   If you have a chronic pain syndrome (i.e. chronic headaches, recurrent back or neck pain, dental pain, abdominal or pelvis pain without a specific diagnosis,  or neuropathic pain such as fibromyalgia) or recurrent visits for the same condition without an acute diagnosis, you may be  treated with non-narcotics and other non-addictive medicines.  Allergic reactions or negative side effects that may be reported by a patient to such medications will not typically lead to the use of a narcotic analgesic or other controlled substance as an alternative.   Patients managing chronic pain with a personal physician should have provisions in place for breakthrough pain.  If you are in crisis, you should call your physician.  If your physician directs you to the emergency department, please have the doctor call and speak to our attending physician concerning your care.   When patients come to the Emergency Department (ED) with acute medical conditions in which the Emergency Department physician feels appropriate to prescribe narcotic or sedating pain medication, the physician will prescribe these in very limited quantities.  The amount of these medications will last only until you can see your primary care physician in his/her office.  Any patient who returns to the ED seeking refills should expect only non-narcotic pain medications.   In the event of an acute medical condition exists and the emergency physician feels it is necessary that the patient be given a narcotic or sedating medication -  a responsible adult driver should be present in the room prior to the medication being given by the nurse.   Prescriptions for narcotic or sedating medications that have been lost, stolen or expired will not be refilled in the Emergency Department.    Patients who have chronic pain may receive non-narcotic prescriptions until seen by their primary care physician.  It is every patient's personal responsibility to maintain active prescriptions with his or her primary care physician or specialist.

## 2011-07-20 NOTE — MAU Provider Note (Signed)
Agree with above note.  Lottie Sigman 07/20/2011 7:09 AM   

## 2011-08-02 ENCOUNTER — Other Ambulatory Visit: Payer: Self-pay | Admitting: Specialist

## 2011-08-02 ENCOUNTER — Ambulatory Visit
Admission: RE | Admit: 2011-08-02 | Discharge: 2011-08-02 | Disposition: A | Discharge status: Home / Self Care | Payer: Medicaid Other | Source: Ambulatory Visit | Attending: Specialist | Admitting: Specialist

## 2011-08-02 DIAGNOSIS — M542 Cervicalgia: Secondary | ICD-10-CM

## 2011-08-02 DIAGNOSIS — R200 Anesthesia of skin: Secondary | ICD-10-CM

## 2011-08-02 DIAGNOSIS — M549 Dorsalgia, unspecified: Secondary | ICD-10-CM

## 2011-08-08 ENCOUNTER — Other Ambulatory Visit: Payer: Medicaid Other

## 2011-10-23 ENCOUNTER — Emergency Department (HOSPITAL_COMMUNITY)
Admission: EM | Admit: 2011-10-23 | Discharge: 2011-10-23 | Disposition: A | Discharge status: Home / Self Care | Payer: Medicaid Other | Source: Home / Self Care | Attending: Emergency Medicine | Admitting: Emergency Medicine

## 2011-10-23 ENCOUNTER — Encounter (HOSPITAL_COMMUNITY): Payer: Self-pay | Admitting: *Deleted

## 2011-10-23 DIAGNOSIS — M797 Fibromyalgia: Secondary | ICD-10-CM

## 2011-10-23 DIAGNOSIS — M542 Cervicalgia: Secondary | ICD-10-CM

## 2011-10-23 DIAGNOSIS — M549 Dorsalgia, unspecified: Secondary | ICD-10-CM

## 2011-10-23 DIAGNOSIS — F329 Major depressive disorder, single episode, unspecified: Secondary | ICD-10-CM

## 2011-10-23 HISTORY — DX: Other chronic pain: G89.29

## 2011-10-23 HISTORY — DX: Cervicalgia: M54.2

## 2011-10-23 MED ORDER — CHLORZOXAZONE 500 MG PO TABS: 500.0000 mg | ORAL_TABLET | Freq: Four times a day (QID) | ORAL | Status: DC | PRN

## 2011-10-23 MED ORDER — CHLORZOXAZONE 500 MG PO TABS: 500.0000 mg | ORAL_TABLET | Freq: Four times a day (QID) | ORAL | Status: AC | PRN

## 2011-10-23 MED ORDER — METHOCARBAMOL 500 MG PO TABS: 500.0000 mg | ORAL_TABLET | Freq: Three times a day (TID) | ORAL | Status: DC

## 2011-10-23 MED ORDER — TRAZODONE HCL 50 MG PO TABS: 50.0000 mg | ORAL_TABLET | Freq: Every day | ORAL | Status: DC
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
## 2011-11-18 ENCOUNTER — Emergency Department (HOSPITAL_COMMUNITY)
Admission: EM | Admit: 2011-11-18 | Discharge: 2011-11-18 | Disposition: A | Discharge status: Home / Self Care | Payer: Medicaid Other | Attending: Emergency Medicine | Admitting: Emergency Medicine

## 2011-11-18 ENCOUNTER — Encounter (HOSPITAL_COMMUNITY): Payer: Self-pay | Admitting: Emergency Medicine

## 2011-11-18 ENCOUNTER — Emergency Department (HOSPITAL_COMMUNITY): Payer: Medicaid Other

## 2011-11-18 DIAGNOSIS — G8929 Other chronic pain: Secondary | ICD-10-CM | POA: Insufficient documentation

## 2011-11-18 DIAGNOSIS — F17200 Nicotine dependence, unspecified, uncomplicated: Secondary | ICD-10-CM | POA: Insufficient documentation

## 2011-11-18 DIAGNOSIS — M542 Cervicalgia: Secondary | ICD-10-CM | POA: Insufficient documentation

## 2011-11-18 DIAGNOSIS — R1033 Periumbilical pain: Secondary | ICD-10-CM | POA: Insufficient documentation

## 2011-11-18 DIAGNOSIS — M549 Dorsalgia, unspecified: Secondary | ICD-10-CM | POA: Insufficient documentation

## 2011-11-18 DIAGNOSIS — R109 Unspecified abdominal pain: Secondary | ICD-10-CM

## 2011-11-18 LAB — URINALYSIS, ROUTINE W REFLEX MICROSCOPIC
Bilirubin Urine: NEGATIVE
Nitrite: NEGATIVE
Specific Gravity, Urine: 1.02 (ref 1.005–1.030)
Urobilinogen, UA: 0.2 mg/dL (ref 0.0–1.0)

## 2011-11-18 LAB — CBC WITH DIFFERENTIAL/PLATELET
Basophils Relative: 1 % (ref 0–1)
Eosinophils Absolute: 0.2 10*3/uL (ref 0.0–0.7)
Hemoglobin: 12.5 g/dL (ref 12.0–15.0)
Lymphs Abs: 1.9 10*3/uL (ref 0.7–4.0)
MCH: 28.9 pg (ref 26.0–34.0)
MCHC: 33.7 g/dL (ref 30.0–36.0)
Monocytes Relative: 6 % (ref 3–12)
Neutro Abs: 3.8 10*3/uL (ref 1.7–7.7)
Neutrophils Relative %: 60 % (ref 43–77)
Platelets: 230 10*3/uL (ref 150–400)
RBC: 4.33 MIL/uL (ref 3.87–5.11)

## 2011-11-18 LAB — COMPREHENSIVE METABOLIC PANEL
ALT: 24 U/L (ref 0–35)
Albumin: 3.3 g/dL — ABNORMAL LOW (ref 3.5–5.2)
Alkaline Phosphatase: 48 U/L (ref 39–117)
BUN: 10 mg/dL (ref 6–23)
Chloride: 107 mEq/L (ref 96–112)
Potassium: 3.8 mEq/L (ref 3.5–5.1)
Sodium: 139 mEq/L (ref 135–145)
Total Bilirubin: 0.5 mg/dL (ref 0.3–1.2)
Total Protein: 6.4 g/dL (ref 6.0–8.3)

## 2011-11-18 LAB — URINE MICROSCOPIC-ADD ON

## 2011-11-18 LAB — POCT PREGNANCY, URINE: Preg Test, Ur: NEGATIVE

## 2011-11-18 MED ORDER — OXYCODONE-ACETAMINOPHEN 5-325 MG PO TABS
1.0000 | ORAL_TABLET | Freq: Once | ORAL | Status: AC
  Administered 2011-11-18: 1 via ORAL
  Filled 2011-11-18: qty 1

## 2011-11-18 MED ORDER — OXYCODONE-ACETAMINOPHEN 5-325 MG PO TABS: 1.0000 | ORAL_TABLET | Freq: Four times a day (QID) | ORAL | Status: AC | PRN

## 2011-11-18 MED ORDER — ONDANSETRON 8 MG PO TBDP
8.0000 mg | ORAL_TABLET | Freq: Once | ORAL | Status: AC
  Administered 2011-11-18: 8 mg via ORAL
  Filled 2011-11-18: qty 1

## 2011-11-18 NOTE — Progress Notes (Signed)
Confirmed with p[facility]

## 2011-11-18 NOTE — ED Notes (Signed)
abd pain, started 2 days ago, saw primary care MD on the 18th for same- given tramadol and muscle relaxer, took yesterday, nauseated, no vomiting, no diarrhea- normally constipated. Does not remember when last period was, stated had a tubal 7 yrs ago, but had a baby 1 yr ago-- states that "they took out the wrong thing"

## 2011-11-19 NOTE — ED Provider Notes (Signed)
History     CSN: [id number]  Arrival date & time 11/18/11  3086   First MD Initiated Contact with Patient 11/18/11 [phone]      Chief Complaint  Patient presents with  . Back Pain  . Abdominal Pain  . Neck Pain    (Consider location/radiation/quality/duration/timing/severity/associated sxs/prior treatment) HPI Patient is a 31 yo female who presents with complaints of continuing chronic neck and back pain for years as well as periumbilical aching abdominal pain with some associated nausea for several days.  She denies constipation, diarrhea, vomiting, fevers, sick contacts, cough, shortness of breath, and chest pain. She denies incontinence concerning for neurologic compromise but does endorse stress incontinence which may relalte to her obesity and prior five vaginal deliveries.  Patient has no numbness, tingling, weakness, precipitating injuries, or risk factors for pathologic fractures.  Patient has an appointment to see Dr. Jennefer Bravo about this chronic issue on 9/2.  She has seen her PCP who does not feel she has fibromyalgia though patient and other physicians have considered this diagnosis.  Patient has not taken any medication for this today and reports 10/10 pain.  She had been using zanaflex and tramadol.  Patient also denies urinary symptoms as well as vaginal d/c. There are no other associated or modifying factors.  Past Medical History  Diagnosis Date  . Chronic back pain   . Drug abuse     marijuana  . Chlamydia   . Smoker   . Obesity   . Complication of anesthesia     perceived problem with epidural  . Chronic neck pain     Past Surgical History  Procedure Date  . Btl   . Tubal ligation     had a baby since BTL    History reviewed. No pertinent family history.  History  Substance Use Topics  . Smoking status: Current Everyday Smoker -- 0.5 packs/day    Types: Cigarettes  . Smokeless tobacco: Never Used  . Alcohol Use: No    OB History    Grav Para Term  Preterm Abortions TAB SAB Ect Mult Living   5 5 5       5       Review of Systems  Constitutional: Positive for fatigue.  HENT: Positive for neck pain.   Eyes: Negative.   Respiratory: Negative.   Cardiovascular: Negative.   Gastrointestinal: Positive for nausea and abdominal pain.  Genitourinary:       Stress incontinence  Musculoskeletal: Positive for back pain.  Skin: Negative.   Neurological: Negative.   Hematological: Negative.   Psychiatric/Behavioral: Negative.   All other systems reviewed and are negative.    Allergies  Hydrocodone  Home Medications   Current Outpatient Rx  Name Route Sig Dispense Refill  . TIZANIDINE HCL 4 MG PO TABS Oral Take 4 mg by mouth 2 (two) times daily as needed. For pain.    Marland Kitchen TRAMADOL-ACETAMINOPHEN 37.5-325 MG PO TABS Oral Take 1 tablet by mouth every 6 (six) hours as needed. For pain.    . OXYCODONE-ACETAMINOPHEN 5-325 MG PO TABS Oral Take 1 tablet by mouth every 6 (six) hours as needed for pain. 10 tablet 0    BP 102/54  Pulse 79  Temp 98.4 F (36.9 C) (Oral)  Resp 18  SpO2 100%  LMP 10/22/2011  Physical Exam  Nursing note and vitals reviewed. GEN: Well-developed, obese female in no distress HEENT: Atraumatic, normocephalic. Oropharynx clear without erythema EYES: PERRLA BL, no scleral icterus. NECK: Trachea midline, no  meningismus CV: regular rate and rhythm. No murmurs, rubs, or gallops PULM: No respiratory distress.  No crackles, wheezes, or rales. GI: soft, mild diffuse TTP. No guarding, rebound. + bowel sounds  GU: deferred Neuro: cranial nerves grossly 2-12 intact, no abnormalities of strength or sensation, A and O x 3 MSK: Patient moves all 4 extremities symmetrically, no deformity, edema, or injury noted. Mild TTP over bilateral trapezius muscles Skin: No rashes petechiae, purpura, or jaundice Psych: no abnormality of mood   ED Course  Procedures (including critical care time)  Labs Reviewed  URINALYSIS,  ROUTINE W REFLEX MICROSCOPIC - Abnormal; Notable for the following:    APPearance CLOUDY (*)     Leukocytes, UA SMALL (*)     All other components within normal limits  URINE MICROSCOPIC-ADD ON - Abnormal; Notable for the following:    Squamous Epithelial / LPF FEW (*)     Bacteria, UA FEW (*)     All other components within normal limits  COMPREHENSIVE METABOLIC PANEL - Abnormal; Notable for the following:    Albumin 3.3 (*)     All other components within normal limits  POCT PREGNANCY, URINE  CBC WITH DIFFERENTIAL  LIPASE, BLOOD  LAB REPORT - SCANNED   Dg Cervical Spine Complete  11/18/2011  *RADIOLOGY REPORT*  Clinical Data: Neck pain.  CERVICAL SPINE - COMPLETE 4+ VIEW  Comparison: Cervical spine CT scan 03/06/2011.  Findings: Mild straightening of the normal cervical lordosis could be due to positioning, muscle spasm or pain.  The vertebral bodies are maintained.  Mild degenerative disc disease at C3-4.  The facets are normally aligned.  The neural foramen are patent.  The C1-2 articulations are maintained.  The lung apices are clear. Small cervical ribs are noted.  IMPRESSION:  1.  Mild degenerative disc disease at C3-4. 2.  Normal alignment and no acute bony findings.   Original Report Authenticated By: P. Loralie Champagne, M.D.      1. Neck pain, chronic   2. Abdominal pain       MDM  Patient was evaluated by myself.  She complained of mild diffuse abdominal pain as well as nausea in addition to chronic issues with her neck and back.  She had no concerning neurologic findings or red flags regarding her neck and back pain.  Chart review showed no plain imaging of the neck had been performed recently but plain films of the remainder of the spine were reviewed and showed no abnormalities.  C-spine films were performed and patient had some mild degenerative disc disease which she can follow-up with Dr. Jennefer Bravo at upcoming appointment.  She did not meet any criteria for emergent MRI  for this.  Patient had negative CBC, CMP, Lipase, and urinalysis and tolerated po fine here.  She was told to follow-up with her PCP and discuss chronic pain management or her neck and back pain with PCP.  Given 10 tabs of percocet but told that this is not a long-term solution for her symptoms.  Discharged in good condition.        Cyndra Numbers, MD 11/19/11 [phone]

## 2011-12-22 ENCOUNTER — Other Ambulatory Visit: Payer: Self-pay | Admitting: Sports Medicine

## 2011-12-22 DIAGNOSIS — M5412 Radiculopathy, cervical region: Secondary | ICD-10-CM

## 2011-12-26 ENCOUNTER — Inpatient Hospital Stay: Admission: RE | Admit: 2011-12-26 | Payer: Medicaid Other | Source: Ambulatory Visit

## 2011-12-26 ENCOUNTER — Other Ambulatory Visit: Payer: Medicaid Other

## 2012-04-18 ENCOUNTER — Emergency Department (HOSPITAL_COMMUNITY)
Admission: EM | Admit: 2012-04-18 | Discharge: 2012-04-18 | Discharge status: Against Medical Advice | Payer: Medicaid Other

## 2012-05-05 ENCOUNTER — Emergency Department (HOSPITAL_COMMUNITY)
Admission: EM | Admit: 2012-05-05 | Discharge: 2012-05-06 | Disposition: A | Discharge status: Home / Self Care | Payer: Medicaid Other | Attending: Emergency Medicine | Admitting: Emergency Medicine

## 2012-05-05 ENCOUNTER — Encounter (HOSPITAL_COMMUNITY): Payer: Self-pay | Admitting: Emergency Medicine

## 2012-05-05 DIAGNOSIS — Z8619 Personal history of other infectious and parasitic diseases: Secondary | ICD-10-CM | POA: Insufficient documentation

## 2012-05-05 DIAGNOSIS — F1210 Cannabis abuse, uncomplicated: Secondary | ICD-10-CM | POA: Insufficient documentation

## 2012-05-05 DIAGNOSIS — E669 Obesity, unspecified: Secondary | ICD-10-CM | POA: Insufficient documentation

## 2012-05-05 DIAGNOSIS — M542 Cervicalgia: Secondary | ICD-10-CM | POA: Insufficient documentation

## 2012-05-05 DIAGNOSIS — R0781 Pleurodynia: Secondary | ICD-10-CM

## 2012-05-05 DIAGNOSIS — F17200 Nicotine dependence, unspecified, uncomplicated: Secondary | ICD-10-CM | POA: Insufficient documentation

## 2012-05-05 DIAGNOSIS — R209 Unspecified disturbances of skin sensation: Secondary | ICD-10-CM | POA: Insufficient documentation

## 2012-05-05 DIAGNOSIS — M549 Dorsalgia, unspecified: Secondary | ICD-10-CM

## 2012-05-05 DIAGNOSIS — G8929 Other chronic pain: Secondary | ICD-10-CM | POA: Insufficient documentation

## 2012-05-05 DIAGNOSIS — R079 Chest pain, unspecified: Secondary | ICD-10-CM | POA: Insufficient documentation

## 2012-05-05 MED ORDER — OXYCODONE-ACETAMINOPHEN 5-325 MG PO TABS
2.0000 | ORAL_TABLET | Freq: Once | ORAL | Status: AC
  Administered 2012-05-05: 2 via ORAL
  Filled 2012-05-05: qty 2

## 2012-05-05 MED ORDER — MORPHINE SULFATE 4 MG/ML IJ SOLN
4.0000 mg | Freq: Once | INTRAMUSCULAR | Status: DC
Start: 2012-05-06 — End: 2012-05-06
  Filled 2012-05-05: qty 1

## 2012-05-05 NOTE — ED Notes (Signed)
Pt states she wants "a room" Pt informed that she is in a room. Pt wants to lie down. Pt's visitor in gurney and pt sitting in a chair. Pt then requested IV meds. Explained to pt that PO meds were ordered and appropriate for her complaint.

## 2012-05-05 NOTE — ED Notes (Signed)
Pt has a ride home.  

## 2012-05-05 NOTE — ED Provider Notes (Signed)
History   This chart was scribed for non-physician practitioner working with Celene Kras, MD by Leone Payor, ED Scribe. This patient was seen in room WTR7/WTR7 and the patient's care was started at 2153.   CSN: [id number]  Arrival date & time 05/05/12  2153   First MD Initiated Contact with Patient 05/05/12 2230      Chief Complaint  Patient presents with  . Rib Injury     The history is provided by the patient. No language interpreter was used.    Vanessa Donaldson is a 32 y.o. female who presents to the Emergency Department complaining of ongoing, intermittent back pain and R side pain with associated numbness and tingling to all four extremities starting 2 years ago. The most recent episode started a few days ago. States the pain initially began after receiving an epidural during childbirth in 2012. She denies recent injury to the affected area. She states starting to see a chiropractor and notes pain worsened since that time. Pain is worse with positioning and movement. Rates pain as 10/10 currently. She has seen her PCP and "many other doctors" for this problem and "no one is able to take away my pain". States she has taken tramadol and muscle relaxers in the past with mild relief. Denies change in bowel and bladder function.   Pt has h/o chronic back pain, chronic neck pain.  Pt is a current everyday smoker and occasional alcohol user.  Past Medical History  Diagnosis Date  . Chronic back pain   . Drug abuse     marijuana  . Chlamydia   . Smoker   . Obesity   . Complication of anesthesia     perceived problem with epidural  . Chronic neck pain     Past Surgical History  Procedure Date  . Btl   . Tubal ligation     had a baby since BTL    No family history on file.  History  Substance Use Topics  . Smoking status: Current Every Day Smoker -- 0.5 packs/day    Types: Cigarettes  . Smokeless tobacco: Never Used  . Alcohol Use: Yes     Comment: occasional    OB  History    Grav Para Term Preterm Abortions TAB SAB Ect Mult Living   5 5 5       5       Review of Systems A complete 10 system review of systems was obtained and all systems are negative except as noted in the HPI and PMH.    Allergies  Hydrocodone  Home Medications   Current Outpatient Rx  Name  Route  Sig  Dispense  Refill  . TRAMADOL-ACETAMINOPHEN 37.5-325 MG PO TABS   Oral   Take 1 tablet by mouth every 6 (six) hours as needed. For pain.           BP 123/72  Pulse 63  Temp 98.1 F (36.7 C) (Oral)  Resp 18  Ht 5\' 2"  (1.575 m)  Wt 240 lb (108.863 kg)  BMI 43.90 kg/m2  SpO2 100%  LMP 04/25/2012  Breastfeeding? No  Physical Exam  Nursing note and vitals reviewed. Constitutional: She is oriented to person, place, and time. She appears well-developed. No distress.       Obese, leaning over the chair in room standing.  HENT:  Head: Normocephalic and atraumatic.  Eyes: Conjunctivae normal and EOM are normal. Pupils are equal, round, and reactive to light.  Neck: Neck supple.  No tracheal deviation present.  Cardiovascular: Normal rate, regular rhythm, normal heart sounds and intact distal pulses.   Pulmonary/Chest: Effort normal and breath sounds normal. No respiratory distress. She has no wheezes. She has no rales. She exhibits no tenderness.  Musculoskeletal: Normal range of motion.       Generalized tenderness to palpation of entire musculoskeletal system. Sensory and strength intact.   Neurological: She is alert and oriented to person, place, and time. She has normal strength. No sensory deficit. Gait normal.  Skin: Skin is warm and dry.  Psychiatric: Her speech is normal. Her affect is blunt. She is agitated.       Poor eye contact.    ED Course  Procedures (including critical care time)  DIAGNOSTIC STUDIES: Oxygen Saturation is 100% on room air, normal by my interpretation.    COORDINATION OF CARE:  10:33 PM Discussed treatment plan.  Labs Reviewed -  No data to display No results found.   1. Back pain   2. Rib pain   3. Chronic pain       MDM  32 year old female presenting with chronic pain with multiple visits for the same the past. No new injury or trauma. Patient appeared uncomfortable initially, 2 Percocet given. Patient states no change in pain. RIA woke her up in a controlled substance database and she does not receive pain medication on a regular basis. I will give her IM morphine. After receiving morphine, patient states "you should have given me that shot in her butt initially, I am not a pill popper". She has an appointment with an orthopedic doctor for cysts on her hands on Monday. I told her to discuss her musculoskeletal symptoms at that visit. No pain medication given at discharge, patient is ambulating without difficulty. No red flags concerning patient's pain. No focal neurologic deficits and no signs of cauda equina. I did look through patient's chart and she has had multiple negative imaging studies in the past. Patient states her understanding of plan and is agreeable.      I personally performed the services described in this documentation, which was scribed in my presence. The recorded information has been reviewed and is accurate.   Trevor Mace, PA-C 05/05/12 2358

## 2012-05-05 NOTE — ED Notes (Signed)
Pt c/o pain to R ribs intermittent x 2 years. Denies injury, pt states she has starting seeing chiropractor, notes pain worse since that time. Worse with positioning, movement.

## 2012-05-07 NOTE — ED Provider Notes (Signed)
Medical screening examination/treatment/procedure(s) were performed by non-physician practitioner and as supervising physician I was immediately available for consultation/collaboration.    Celene Kras, MD 05/07/12 2149

## 2012-05-09 MED FILL — Morphine Sulfate Inj 4 MG/ML: INTRAMUSCULAR | Qty: 1 | Status: AC

## 2012-06-20 ENCOUNTER — Emergency Department (HOSPITAL_COMMUNITY): Payer: Medicaid Other

## 2012-06-20 ENCOUNTER — Emergency Department (HOSPITAL_COMMUNITY)
Admission: EM | Admit: 2012-06-20 | Discharge: 2012-06-21 | Disposition: A | Discharge status: Home / Self Care | Payer: Medicaid Other | Attending: Emergency Medicine | Admitting: Emergency Medicine

## 2012-06-20 DIAGNOSIS — K8020 Calculus of gallbladder without cholecystitis without obstruction: Secondary | ICD-10-CM | POA: Insufficient documentation

## 2012-06-20 DIAGNOSIS — G8929 Other chronic pain: Secondary | ICD-10-CM | POA: Insufficient documentation

## 2012-06-20 DIAGNOSIS — Z3202 Encounter for pregnancy test, result negative: Secondary | ICD-10-CM | POA: Insufficient documentation

## 2012-06-20 DIAGNOSIS — E669 Obesity, unspecified: Secondary | ICD-10-CM | POA: Insufficient documentation

## 2012-06-20 DIAGNOSIS — R0789 Other chest pain: Secondary | ICD-10-CM

## 2012-06-20 DIAGNOSIS — F17200 Nicotine dependence, unspecified, uncomplicated: Secondary | ICD-10-CM | POA: Insufficient documentation

## 2012-06-20 DIAGNOSIS — Z8619 Personal history of other infectious and parasitic diseases: Secondary | ICD-10-CM | POA: Insufficient documentation

## 2012-06-20 DIAGNOSIS — R1310 Dysphagia, unspecified: Secondary | ICD-10-CM | POA: Insufficient documentation

## 2012-06-20 DIAGNOSIS — R071 Chest pain on breathing: Secondary | ICD-10-CM | POA: Insufficient documentation

## 2012-06-20 MED ORDER — GI COCKTAIL ~~LOC~~
30.0000 mL | Freq: Once | ORAL | Status: AC
  Administered 2012-06-21: 30 mL via ORAL
  Filled 2012-06-20: qty 30

## 2012-06-20 NOTE — ED Notes (Signed)
Pt is poor historian,  Pt said she slept all day after taking a percocet that somebody gave,  And when she woke up it felt like her chest was crushing when she sits up,, cough and difficulty swallowing for two weeks,  Pt smells strongly of smoke products

## 2012-06-20 NOTE — ED Provider Notes (Signed)
History     CSN: [id number]  Arrival date & time 06/20/12  2225   First MD Initiated Contact with Patient 06/20/12 2339      Chief Complaint  Patient presents with  . Cough  . Dysphagia    (Consider location/radiation/quality/duration/timing/severity/associated sxs/prior treatment) HPI Comments: Patient presents with anterior chest pain that she has had all day. It is worse with sitting up, worse with palpation worse with coughing. She reports having this pain similar in the past but never lasting this long. She took a Microbiologist she bought on the street without relief. Denies any nausea vomiting or shortness of breath. She is a poor historian. She states she has frequent visits for various aches and pains but no one ever tells her what is wrong. She denies any leg pain or swelling. No recent travel. She is not on birth control she has had a tubal ligation which she says was not effective.  The history is provided by the patient.    Past Medical History  Diagnosis Date  . Chronic back pain   . Drug abuse     marijuana  . Chlamydia   . Smoker   . Obesity   . Complication of anesthesia     perceived problem with epidural  . Chronic neck pain     Past Surgical History  Procedure Laterality Date  . Btl    . Tubal ligation      had a baby since BTL    No family history on file.  History  Substance Use Topics  . Smoking status: Current Every Day Smoker -- 0.50 packs/day    Types: Cigarettes  . Smokeless tobacco: Never Used  . Alcohol Use: Yes     Comment: occasional    OB History   Grav Para Term Preterm Abortions TAB SAB Ect Mult Living   5 5 5       5       Review of Systems  Constitutional: Negative for activity change and appetite change.  HENT: Negative for congestion and rhinorrhea.   Respiratory: Positive for cough and chest tightness. Negative for shortness of breath.   Cardiovascular: Positive for chest pain.  Gastrointestinal: Negative for nausea,  vomiting and abdominal pain.  Genitourinary: Negative for dysuria, vaginal bleeding and vaginal discharge.  Musculoskeletal: Negative for back pain.  Skin: Negative for rash.  Neurological: Negative for dizziness, weakness and headaches.  A complete 10 system review of systems was obtained and all systems are negative except as noted in the HPI and PMH.    Allergies  Hydrocodone  Home Medications  No current outpatient prescriptions on file.  BP 100/62  Pulse 53  Temp(Src) 97.7 F (36.5 C) (Oral)  Resp 21  SpO2 98%  LMP 06/19/2012  Physical Exam  Constitutional: She is oriented to person, place, and time. She appears well-developed and well-nourished. No distress.  HENT:  Head: Normocephalic and atraumatic.  Mouth/Throat: Oropharynx is clear and moist. No oropharyngeal exudate.  Eyes: Conjunctivae and EOM are normal. Pupils are equal, round, and reactive to light.  Neck: Normal range of motion. Neck supple.  Cardiovascular: Normal rate, regular rhythm and normal heart sounds.   No murmur heard. Pulmonary/Chest: Effort normal and breath sounds normal. No respiratory distress. She exhibits tenderness.  Reproducible anterior chest wall tenderness without rash  Abdominal: Soft. There is no tenderness. There is no rebound and no guarding.  Nontender, negative Murphy's sign.  Musculoskeletal: Normal range of motion. She exhibits no edema and  no tenderness.  Neurological: She is alert and oriented to person, place, and time. No cranial nerve deficit. She exhibits normal muscle tone. Coordination normal.  Skin: Skin is warm.    ED Course  Procedures (including critical care time)  Labs Reviewed  D-DIMER, QUANTITATIVE - Abnormal; Notable for the following:    D-Dimer, Quant 0.64 (*)    All other components within normal limits  COMPREHENSIVE METABOLIC PANEL - Abnormal; Notable for the following:    Albumin 3.2 (*)    All other components within normal limits  URINALYSIS,  ROUTINE W REFLEX MICROSCOPIC - Abnormal; Notable for the following:    Color, Urine AMBER (*)    APPearance CLOUDY (*)    Specific Gravity, Urine 1.031 (*)    Hgb urine dipstick LARGE (*)    Protein, ur 100 (*)    Leukocytes, UA SMALL (*)    All other components within normal limits  URINE RAPID DRUG SCREEN (HOSP PERFORMED) - Abnormal; Notable for the following:    Opiates POSITIVE (*)    Tetrahydrocannabinol POSITIVE (*)    All other components within normal limits  URINE MICROSCOPIC-ADD ON - Abnormal; Notable for the following:    Squamous Epithelial / LPF FEW (*)    Bacteria, UA MANY (*)    All other components within normal limits  URINE CULTURE  TROPONIN I  CBC WITH DIFFERENTIAL  LIPASE, BLOOD  PREGNANCY, URINE   Ct Abdomen Pelvis Wo Contrast  06/21/2012  *RADIOLOGY REPORT*  Clinical Data: Flank pain.  Hematuria.  CT ABDOMEN AND PELVIS WITHOUT CONTRAST  Technique:  Multidetector CT imaging of the abdomen and pelvis was performed following the standard protocol without intravenous contrast.  Comparison: None.  Findings: Lung Bases: Clear.  Liver:  Unenhanced CT was performed per clinician order.  Lack of IV contrast limits sensitivity and specificity, especially for evaluation of abdominal/pelvic solid viscera.  Spleen:  Normal.  Gallbladder:  Cholelithiasis.  Gallbladder is intrahepatic. Several gallstones are calcified and showed nitrogen gas.  Common bile duct:  Normal.  No common duct stone identified.  Pancreas:  Normal.  Adrenal glands:  Normal.  Kidneys:  Kidneys are excreting contrast from prior CT scan of the chest.  Both ureters appear within normal limits.  No obstruction identified.  Stomach:  Grossly normal.  Small bowel:  Normal.  Grossly normal.  No mesenteric adenopathy. No inflammatory changes or obstruction.  Colon:   Normal appendix.  Large stool burden.  Pelvic Genitourinary:  Physiologic appearance of the uterus and adnexa.  Excreted contrast opacifies the collapsed  urinary bladder. No free fluid.  The ovaries appear physiologic.  Bones:  No aggressive osseous lesions.  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  Vasculature: Normal.  Scarring is present in the periumbilical region and infraumbilical abdominal wall.  Small fat containing periumbilical hernia.  IMPRESSION: No acute abnormality.  Excreted contrast within the ureters and urinary bladder.  No evidence of obstruction.  Cholelithiasis appears similar to the prior exams.   Original Report Authenticated By: Andreas Newport, M.D.    Dg Chest 2 View  06/20/2012  *RADIOLOGY REPORT*  Clinical Data: Cough and chest pain.  CHEST - 2 VIEW  Comparison: None  Findings: The cardiac silhouette, mediastinal and hilar contours are normal.  The lungs are clear.  No pleural effusion.  The bony thorax is intact.  IMPRESSION: No acute cardiopulmonary findings.   Original Report Authenticated By: Rudie Meyer, M.D.    Ct Angio  Chest Pe W/cm &/or Wo Cm  06/21/2012  *RADIOLOGY REPORT*  Clinical Data: Crushing chest pain.  Cough.  Difficulty swallowing. Elevated D-dimer.  Pulmonary embolism.  CT ANGIOGRAPHY CHEST  Technique:  Multidetector CT imaging of the chest using the standard protocol during bolus administration of intravenous contrast. Multiplanar reconstructed images including MIPs were obtained and reviewed to evaluate the vascular anatomy.  Contrast: OMNIPAQUE IOHEXOL 350 MG/ML SOLN  Comparison: 06/20/2012.  Findings: Technically adequate study without pulmonary embolus.  No acute aortic abnormality allowing for contrast phase.  The lungs demonstrate dependent atelectasis.  Central airways are patent.  No airspace disease.  No effusions.  No adenopathy. No aggressive osseous lesions.  IMPRESSION: Negative CTA chest.   Original Report Authenticated By: Andreas Newport, M.D.      No diagnosis found.    MDM  One day history of constant anterior chest pain  associated with shortness of breath. Vitals stable, no distress.  pain is worse with sitting forward, worse with palpation and worse with coughing.  Chest pain is atypical for ACS reproducible. EKG is nonischemic, troponin is negative. D-dimer positive. CT PE negative.  Patient with hematuria though she is on her period now.he states she has a history of gallstones and she is wondering if that is contributing to her pain. on exam today she has no right upper quadrant tenderness and negative Murphy sign. LFTs and lipase are normal. She'll be referred to Gen. surgery for cholelithiasis. Her chest pain today appears to be musculoskeletal and will be treated with anti-inflammatories. She is advised not to take medications that are not prescribed to her.   Date: 06/21/2012  Rate: 66  Rhythm: normal sinus rhythm  QRS Axis: normal  Intervals: normal  ST/T Wave abnormalities: nonspecific ST/T changes  Conduction Disutrbances:none  Narrative Interpretation:   Old EKG Reviewed: none available    Glynn Octave, MD 06/21/12 [phone]

## 2012-06-21 ENCOUNTER — Emergency Department (HOSPITAL_COMMUNITY): Payer: Medicaid Other

## 2012-06-21 ENCOUNTER — Encounter (HOSPITAL_COMMUNITY): Payer: Self-pay

## 2012-06-21 LAB — CBC WITH DIFFERENTIAL/PLATELET
Eosinophils Absolute: 0.2 10*3/uL (ref 0.0–0.7)
Eosinophils Relative: 3 % (ref 0–5)
HCT: 38.7 % (ref 36.0–46.0)
Hemoglobin: 13.2 g/dL (ref 12.0–15.0)
Lymphocytes Relative: 37 % (ref 12–46)
Lymphs Abs: 2.6 10*3/uL (ref 0.7–4.0)
MCH: 29.2 pg (ref 26.0–34.0)
MCV: 85.6 fL (ref 78.0–100.0)
Monocytes Absolute: 0.4 10*3/uL (ref 0.1–1.0)
Monocytes Relative: 6 % (ref 3–12)
RBC: 4.52 MIL/uL (ref 3.87–5.11)
WBC: 7.1 10*3/uL (ref 4.0–10.5)

## 2012-06-21 LAB — COMPREHENSIVE METABOLIC PANEL
ALT: 13 U/L (ref 0–35)
BUN: 10 mg/dL (ref 6–23)
CO2: 23 mEq/L (ref 19–32)
Calcium: 8.7 mg/dL (ref 8.4–10.5)
Creatinine, Ser: 0.63 mg/dL (ref 0.50–1.10)
GFR calc Af Amer: >90 mL/min (ref >90)
GFR calc non Af Amer: >90 mL/min (ref >90)
Glucose, Bld: 90 mg/dL (ref 70–99)
Sodium: 136 mEq/L (ref 135–145)

## 2012-06-21 LAB — URINALYSIS, ROUTINE W REFLEX MICROSCOPIC
Bilirubin Urine: NEGATIVE
Nitrite: NEGATIVE
Protein, ur: 100 mg/dL — AB
Urobilinogen, UA: 0.2 mg/dL (ref 0.0–1.0)

## 2012-06-21 LAB — RAPID URINE DRUG SCREEN, HOSP PERFORMED
Barbiturates: NOT DETECTED
Opiates: POSITIVE — AB
Tetrahydrocannabinol: POSITIVE — AB

## 2012-06-21 LAB — LIPASE, BLOOD: Lipase: 31 U/L (ref 11–59)

## 2012-06-21 LAB — URINE MICROSCOPIC-ADD ON

## 2012-06-21 MED ORDER — IOHEXOL 350 MG/ML SOLN
100.0000 mL | Freq: Once | INTRAVENOUS | Status: AC | PRN
  Administered 2012-06-21: 100 mL via INTRAVENOUS

## 2012-06-21 MED ORDER — IBUPROFEN 800 MG PO TABS
800.0000 mg | ORAL_TABLET | Freq: Once | ORAL | Status: AC
  Administered 2012-06-21: 800 mg via ORAL
  Filled 2012-06-21: qty 1

## 2012-06-21 MED ORDER — DIPHENHYDRAMINE HCL 25 MG PO CAPS
25.0000 mg | ORAL_CAPSULE | Freq: Once | ORAL | Status: AC
  Administered 2012-06-21: 25 mg via ORAL
  Filled 2012-06-21: qty 1

## 2012-06-21 MED ORDER — ONDANSETRON HCL 4 MG PO TABS: 4.0000 mg | ORAL_TABLET | Freq: Four times a day (QID) | ORAL | Status: DC

## 2012-06-21 MED ORDER — KETOROLAC TROMETHAMINE 60 MG/2ML IM SOLN
60.0000 mg | Freq: Once | INTRAMUSCULAR | Status: DC
  Filled 2012-06-21: qty 2

## 2012-06-21 MED ORDER — OMEPRAZOLE 20 MG PO CPDR: 20.0000 mg | DELAYED_RELEASE_CAPSULE | Freq: Every day | ORAL | Status: DC

## 2012-06-21 NOTE — ED Notes (Signed)
Pt reports of itching at IV site.  MD made aware.

## 2012-06-21 NOTE — ED Notes (Signed)
Patient transported to CT 

## 2012-06-22 LAB — URINE CULTURE: Colony Count: 65000

## 2012-11-07 ENCOUNTER — Ambulatory Visit (INDEPENDENT_AMBULATORY_CARE_PROVIDER_SITE_OTHER): Payer: Medicaid Other | Admitting: Surgery

## 2012-11-07 ENCOUNTER — Encounter (INDEPENDENT_AMBULATORY_CARE_PROVIDER_SITE_OTHER): Payer: Self-pay | Admitting: Surgery

## 2012-11-07 VITALS — BP 102/78 | HR 80 | Resp 16 | Ht 62.0 in | Wt 240.2 lb

## 2012-11-07 DIAGNOSIS — K8020 Calculus of gallbladder without cholecystitis without obstruction: Secondary | ICD-10-CM

## 2012-11-07 NOTE — Patient Instructions (Signed)

## 2012-11-07 NOTE — Progress Notes (Signed)
Chief Complaint:  Recurrent right upper quadrant abdominal pain with known gallstones  History of Present Illness:  Vanessa Donaldson is an 32 y.o. female who is seen in the clinic today with a six-year history of abdominal pain which she describes beneath her right breast. This is nG department where CT scans have been done which showed gallstones but no other intra-abdominal processes. She's had this problem since she had a baby. She came seeking to have this problem addressed and have her gallbladder removed. I gave her a booklet on gallbladder surgery and explained this to her in some detail. She was distracted and at the time of our office encounter she was on the phone with people trying to fix her cell phone. Despite the distraction I was able to discuss this with her and will proceed toward cholecystectomy.  Past Medical History  Diagnosis Date  . Chronic back pain   . Drug abuse     marijuana  . Chlamydia   . Smoker   . Obesity   . Complication of anesthesia     perceived problem with epidural  . Chronic neck pain     Past Surgical History  Procedure Laterality Date  . Btl    . Tubal ligation      had a baby since BTL    Current Outpatient Prescriptions  Medication Sig Dispense Refill  . phentermine 15 MG capsule Take 15 mg by mouth every morning.      . omeprazole (PRILOSEC) 20 MG capsule Take 1 capsule (20 mg total) by mouth daily.  30 capsule  0  . ondansetron (ZOFRAN) 4 MG tablet Take 1 tablet (4 mg total) by mouth every 6 (six) hours.  12 tablet  0   No current facility-administered medications for this visit.   Hydrocodone History reviewed. No pertinent family history. Social History:   reports that she has been smoking Cigarettes.  She has been smoking about 0.25 packs per day. She has never used smokeless tobacco. She reports that  drinks alcohol. She reports that she does not use illicit drugs.   REVIEW OF SYSTEMS - PERTINENT POSITIVES ONLY: Negative except for  right upper quadrant pain  Physical Exam:   Blood pressure 102/78, pulse 80, resp. rate 16, height 5' 2" (1.575 m), weight 240 lb 3.2 oz (108.954 kg). Body mass index is 43.92 kg/(m^2).  Gen:  WDWN AAF NAD  Neurological: Alert and oriented to person, place, and time. Motor and sensory function is grossly intact  Head: Normocephalic and atraumatic.  Eyes: Conjunctivae are normal. Pupils are equal, round, and reactive to light. No scleral icterus.  Neck: Normal range of motion. Neck supple. No tracheal deviation or thyromegaly present.  Cardiovascular:  SR without murmurs or gallops.  No carotid bruits Respiratory: Effort normal.  No respiratory distress. No chest wall tenderness. Breath sounds normal.  No wheezes, rales or rhonchi.  Abdomen:  Obese with pain in right upper quadrant but no rebound or guarding GU: Musculoskeletal: Normal range of motion. Extremities are nontender. No cyanosis, edema or clubbing noted Lymphadenopathy: No cervical, preauricular, postauricular or axillary adenopathy is present Skin: Skin is warm and dry. No rash noted. No diaphoresis. No erythema. No pallor. Pscyh: Normal mood and affect. Behavior is normal. Judgment and thought content normal.   LABORATORY RESULTS: No results found for this or any previous visit (from the past 48 hour(s)).  RADIOLOGY RESULTS: No results found.  Problem List: Patient Active Problem List   Diagnosis Date Noted  .   Gallstones 11/07/2012  . Chronic back pain 01/04/2011  . Drug abuse 01/04/2011  . History of chlamydia 01/04/2011  . Active smoker 01/04/2011  . Obesity 01/04/2011    Assessment & Plan: Gallstones with history of recurring visits to the ER for RUQ abdominal pain.  Will schedule for lap chole     Matt B. Robb Sibal, MD, FACS  [facility] [phone] beeper [phone]  11/07/2012 5:28 PM     

## 2012-11-08 NOTE — Progress Notes (Signed)
Need orders in EPic.  Surgery scheduled for 11/20/12.  Thank You.

## 2012-11-14 NOTE — Patient Instructions (Signed)
Kimbella L Choo  11/14/2012   Your procedure is scheduled on:  11/20/12               Surgery 1224pm-228pm  Report to [facility] at     1000  AM.  Call this number if you have problems the morning of surgery: [phone]   Remember:   Do not eat food or drink liquids after midnight.   Take these medicines the morning of surgery with A SIP OF WATER:    Do not wear jewelry, make-up or nail polish.  Do not wear lotions, powders, or perfumes.   Do not shave 48 hours prior to surgery.   Do not bring valuables to the hospital.  Contacts, dentures or bridgework may not be worn into surgery.     Patients discharged the day of surgery will not be allowed to drive  home.  Name and phone number of your driver:   SEE CHG INSTRUCTION SHEET    Please read over the following fact sheets that you were given: coughing and deep breathing exercises, leg exercises               Failure to comply with these instructions may result in cancellation of your surgery.                Patient Signature ____________________________              Nurse Signature _____________________________

## 2012-11-15 ENCOUNTER — Encounter (HOSPITAL_COMMUNITY): Payer: Self-pay | Admitting: Pharmacy Technician

## 2012-11-15 ENCOUNTER — Encounter (HOSPITAL_COMMUNITY)
Admission: RE | Admit: 2012-11-15 | Discharge: 2012-11-15 | Disposition: A | Discharge status: Home / Self Care | Payer: Medicaid Other | Source: Ambulatory Visit | Attending: Surgery | Admitting: Surgery

## 2012-11-15 ENCOUNTER — Encounter (HOSPITAL_COMMUNITY): Payer: Self-pay

## 2012-11-15 DIAGNOSIS — Z01812 Encounter for preprocedural laboratory examination: Secondary | ICD-10-CM | POA: Insufficient documentation

## 2012-11-15 LAB — CBC
HCT: 39.7 % (ref 36.0–46.0)
Hemoglobin: 13.8 g/dL (ref 12.0–15.0)
MCH: 30.3 pg (ref 26.0–34.0)
MCV: 87.3 fL (ref 78.0–100.0)
Platelets: 242 10*3/uL (ref 150–400)
RBC: 4.55 MIL/uL (ref 3.87–5.11)
WBC: 5.2 10*3/uL (ref 4.0–10.5)

## 2012-11-15 NOTE — Progress Notes (Signed)
Need orders in EPIC.  Surgery scheduled for 11/20/12.  Thank You.  

## 2012-11-20 ENCOUNTER — Encounter (HOSPITAL_COMMUNITY): Payer: Self-pay | Admitting: Anesthesiology

## 2012-11-20 ENCOUNTER — Ambulatory Visit (HOSPITAL_COMMUNITY)
Admission: RE | Admit: 2012-11-20 | Discharge: 2012-11-20 | Disposition: A | Discharge status: Home / Self Care | Payer: Medicaid Other | Source: Ambulatory Visit | Attending: Surgery | Admitting: Surgery

## 2012-11-20 ENCOUNTER — Encounter (HOSPITAL_COMMUNITY): Payer: Self-pay | Admitting: *Deleted

## 2012-11-20 ENCOUNTER — Ambulatory Visit (HOSPITAL_COMMUNITY): Payer: Medicaid Other

## 2012-11-20 ENCOUNTER — Ambulatory Visit (HOSPITAL_COMMUNITY): Payer: Medicaid Other | Admitting: Anesthesiology

## 2012-11-20 ENCOUNTER — Encounter (HOSPITAL_COMMUNITY)
Admission: RE | Disposition: A | Discharge status: Home / Self Care | Payer: Self-pay | Source: Ambulatory Visit | Attending: Surgery

## 2012-11-20 DIAGNOSIS — Z9049 Acquired absence of other specified parts of digestive tract: Secondary | ICD-10-CM

## 2012-11-20 DIAGNOSIS — K8010 Calculus of gallbladder with chronic cholecystitis without obstruction: Secondary | ICD-10-CM

## 2012-11-20 DIAGNOSIS — F17200 Nicotine dependence, unspecified, uncomplicated: Secondary | ICD-10-CM | POA: Insufficient documentation

## 2012-11-20 DIAGNOSIS — Z79899 Other long term (current) drug therapy: Secondary | ICD-10-CM | POA: Insufficient documentation

## 2012-11-20 HISTORY — PX: RECTAL EXAM UNDER ANESTHESIA: SHX6399

## 2012-11-20 HISTORY — PX: CHOLECYSTECTOMY: SHX55

## 2012-11-20 SURGERY — LAPAROSCOPIC CHOLECYSTECTOMY WITH INTRAOPERATIVE CHOLANGIOGRAM
Anesthesia: General | Site: Rectum | Wound class: Contaminated

## 2012-11-20 MED ORDER — CEFAZOLIN SODIUM-DEXTROSE 2-3 GM-% IV SOLR
2.0000 g | Freq: Once | INTRAVENOUS | Status: AC
  Administered 2012-11-20: 2 g via INTRAVENOUS

## 2012-11-20 MED ORDER — OXYCODONE-ACETAMINOPHEN 5-325 MG PO TABS: 1.0000 | ORAL_TABLET | ORAL | Status: DC | PRN

## 2012-11-20 MED ORDER — PROPOFOL 10 MG/ML IV BOLUS
INTRAVENOUS | Status: DC | PRN
  Administered 2012-11-20: 200 mg via INTRAVENOUS

## 2012-11-20 MED ORDER — IOHEXOL 300 MG/ML  SOLN
INTRAMUSCULAR | Status: AC
  Filled 2012-11-20: qty 1

## 2012-11-20 MED ORDER — SODIUM CHLORIDE 0.9 % IJ SOLN: 3.0000 mL | INTRAMUSCULAR | Status: DC | PRN

## 2012-11-20 MED ORDER — BUPIVACAINE-EPINEPHRINE PF 0.25-1:200000 % IJ SOLN
INTRAMUSCULAR | Status: AC
  Filled 2012-11-20: qty 30

## 2012-11-20 MED ORDER — ONDANSETRON HCL 4 MG/2ML IJ SOLN: 4.0000 mg | Freq: Four times a day (QID) | INTRAMUSCULAR | Status: DC | PRN

## 2012-11-20 MED ORDER — ONDANSETRON HCL 4 MG/2ML IJ SOLN
INTRAMUSCULAR | Status: DC | PRN
  Administered 2012-11-20 (×2): 2 mg via INTRAVENOUS

## 2012-11-20 MED ORDER — BUPIVACAINE LIPOSOME 1.3 % IJ SUSP
20.0000 mL | Freq: Once | INTRAMUSCULAR | Status: DC
  Filled 2012-11-20: qty 20

## 2012-11-20 MED ORDER — LACTATED RINGERS IV SOLN
INTRAVENOUS | Status: DC
  Administered 2012-11-20 (×2): via INTRAVENOUS
  Administered 2012-11-20: 1000 mL via INTRAVENOUS

## 2012-11-20 MED ORDER — SODIUM CHLORIDE 0.9 % IV SOLN: 250.0000 mL | INTRAVENOUS | Status: DC | PRN

## 2012-11-20 MED ORDER — LACTATED RINGERS IR SOLN
Status: DC | PRN
  Administered 2012-11-20: 1000 mL

## 2012-11-20 MED ORDER — SUCCINYLCHOLINE CHLORIDE 20 MG/ML IJ SOLN
INTRAMUSCULAR | Status: DC | PRN
  Administered 2012-11-20: 140 mg via INTRAVENOUS

## 2012-11-20 MED ORDER — OXYCODONE HCL 5 MG PO TABS
5.0000 mg | ORAL_TABLET | ORAL | Status: DC | PRN
  Administered 2012-11-20: 5 mg via ORAL
  Filled 2012-11-20: qty 1

## 2012-11-20 MED ORDER — HYDROMORPHONE HCL PF 1 MG/ML IJ SOLN
INTRAMUSCULAR | Status: AC
  Filled 2012-11-20: qty 1

## 2012-11-20 MED ORDER — ACETAMINOPHEN 650 MG RE SUPP
650.0000 mg | RECTAL | Status: DC | PRN
  Filled 2012-11-20: qty 1

## 2012-11-20 MED ORDER — ACETAMINOPHEN 325 MG PO TABS: 650.0000 mg | ORAL_TABLET | ORAL | Status: DC | PRN

## 2012-11-20 MED ORDER — KCL IN DEXTROSE-NACL 20-5-0.45 MEQ/L-%-% IV SOLN: INTRAVENOUS | Status: DC

## 2012-11-20 MED ORDER — NEOSTIGMINE METHYLSULFATE 1 MG/ML IJ SOLN
INTRAMUSCULAR | Status: DC | PRN
  Administered 2012-11-20: 2 mg via INTRAVENOUS

## 2012-11-20 MED ORDER — CEFAZOLIN SODIUM-DEXTROSE 2-3 GM-% IV SOLR
INTRAVENOUS | Status: AC
  Filled 2012-11-20: qty 50

## 2012-11-20 MED ORDER — METOCLOPRAMIDE HCL 5 MG/ML IJ SOLN
INTRAMUSCULAR | Status: DC | PRN
  Administered 2012-11-20: 5 mg via INTRAVENOUS

## 2012-11-20 MED ORDER — ACETAMINOPHEN 10 MG/ML IV SOLN
1000.0000 mg | Freq: Four times a day (QID) | INTRAVENOUS | Status: DC
  Filled 2012-11-20: qty 100

## 2012-11-20 MED ORDER — FENTANYL CITRATE 0.05 MG/ML IJ SOLN
INTRAMUSCULAR | Status: DC | PRN
  Administered 2012-11-20: 50 ug via INTRAVENOUS
  Administered 2012-11-20: 75 ug via INTRAVENOUS
  Administered 2012-11-20 (×2): 50 ug via INTRAVENOUS
  Administered 2012-11-20: 25 ug via INTRAVENOUS
  Administered 2012-11-20: 50 ug via INTRAVENOUS
  Administered 2012-11-20: 100 ug via INTRAVENOUS
  Administered 2012-11-20 (×2): 50 ug via INTRAVENOUS

## 2012-11-20 MED ORDER — SODIUM CHLORIDE 0.9 % IJ SOLN: 3.0000 mL | Freq: Two times a day (BID) | INTRAMUSCULAR | Status: DC

## 2012-11-20 MED ORDER — HYDROMORPHONE HCL PF 1 MG/ML IJ SOLN
0.2500 mg | INTRAMUSCULAR | Status: DC | PRN
  Administered 2012-11-20 (×3): 0.5 mg via INTRAVENOUS

## 2012-11-20 MED ORDER — IOHEXOL 300 MG/ML  SOLN
INTRAMUSCULAR | Status: DC | PRN
  Administered 2012-11-20: 50 mL via INTRAVENOUS

## 2012-11-20 MED ORDER — HEPARIN SODIUM (PORCINE) 5000 UNIT/ML IJ SOLN
5000.0000 [IU] | Freq: Once | INTRAMUSCULAR | Status: AC
  Administered 2012-11-20: 5000 [IU] via SUBCUTANEOUS
  Filled 2012-11-20: qty 1

## 2012-11-20 MED ORDER — ACETAMINOPHEN 10 MG/ML IV SOLN
INTRAVENOUS | Status: DC | PRN
  Administered 2012-11-20: 1000 mg via INTRAVENOUS

## 2012-11-20 MED ORDER — LIDOCAINE HCL (CARDIAC) 20 MG/ML IV SOLN
INTRAVENOUS | Status: DC | PRN
  Administered 2012-11-20: 75 mg via INTRAVENOUS

## 2012-11-20 MED ORDER — CISATRACURIUM BESYLATE (PF) 10 MG/5ML IV SOLN
INTRAVENOUS | Status: DC | PRN
  Administered 2012-11-20: 2 mg via INTRAVENOUS
  Administered 2012-11-20: 8 mg via INTRAVENOUS

## 2012-11-20 MED ORDER — GLYCOPYRROLATE 0.2 MG/ML IJ SOLN
INTRAMUSCULAR | Status: DC | PRN
  Administered 2012-11-20: .2 mg via INTRAVENOUS
  Administered 2012-11-20: 0.4 mg via INTRAVENOUS

## 2012-11-20 MED ORDER — BUPIVACAINE-EPINEPHRINE PF 0.25-1:200000 % IJ SOLN
INTRAMUSCULAR | Status: DC | PRN
  Administered 2012-11-20: 30 mL

## 2012-11-20 MED ORDER — LACTATED RINGERS IV SOLN: INTRAVENOUS | Status: DC

## 2012-11-20 MED ORDER — SODIUM CHLORIDE 0.9 % IR SOLN
Status: DC | PRN
  Administered 2012-11-20: 1000 mL

## 2012-11-20 MED ORDER — DEXAMETHASONE SODIUM PHOSPHATE 10 MG/ML IJ SOLN
INTRAMUSCULAR | Status: DC | PRN
  Administered 2012-11-20: 10 mg via INTRAVENOUS

## 2012-11-20 MED ORDER — MIDAZOLAM HCL 5 MG/5ML IJ SOLN
INTRAMUSCULAR | Status: DC | PRN
  Administered 2012-11-20: .5 mg via INTRAVENOUS
  Administered 2012-11-20: 1 mg via INTRAVENOUS

## 2012-11-20 SURGICAL SUPPLY — 48 items
ADH SKN CLS APL DERMABOND .7 (GAUZE/BANDAGES/DRESSINGS) ×2
APL SKNCLS STERI-STRIP NONHPOA (GAUZE/BANDAGES/DRESSINGS)
APPLIER CLIP 5 13 M/L LIGAMAX5 (MISCELLANEOUS)
APPLIER CLIP ROT 10 11.4 M/L (STAPLE)
APR CLP MED LRG 11.4X10 (STAPLE)
APR CLP MED LRG 5 ANG JAW (MISCELLANEOUS)
BAG SPEC RTRVL LRG 6X4 10 (ENDOMECHANICALS) ×2
BENZOIN TINCTURE PRP APPL 2/3 (GAUZE/BANDAGES/DRESSINGS) ×2 IMPLANT
CABLE HIGH FREQUENCY MONO STRZ (ELECTRODE) IMPLANT
CANISTER SUCTION 2500CC (MISCELLANEOUS) ×3 IMPLANT
CATH REDDICK CHOLANGI 4FR 50CM (CATHETERS) IMPLANT
CLIP APPLIE 5 13 M/L LIGAMAX5 (MISCELLANEOUS) IMPLANT
CLIP APPLIE ROT 10 11.4 M/L (STAPLE) IMPLANT
CLOTH BEACON ORANGE TIMEOUT ST (SAFETY) ×3 IMPLANT
COVER MAYO STAND STRL (DRAPES) ×3 IMPLANT
COVER SURGICAL LIGHT HANDLE (MISCELLANEOUS) ×2 IMPLANT
DECANTER SPIKE VIAL GLASS SM (MISCELLANEOUS) ×2 IMPLANT
DERMABOND ADVANCED (GAUZE/BANDAGES/DRESSINGS) ×1
DERMABOND ADVANCED .7 DNX12 (GAUZE/BANDAGES/DRESSINGS) IMPLANT
DEVICE TROCAR PUNCTURE CLOSURE (ENDOMECHANICALS) ×1 IMPLANT
DRAPE C-ARM 42X120 X-RAY (DRAPES) ×3 IMPLANT
DRAPE LAPAROSCOPIC ABDOMINAL (DRAPES) ×3 IMPLANT
ELECT REM PT RETURN 9FT ADLT (ELECTROSURGICAL) ×3
ELECTRODE REM PT RTRN 9FT ADLT (ELECTROSURGICAL) ×2 IMPLANT
GLOVE BIOGEL M 8.0 STRL (GLOVE) ×3 IMPLANT
GOWN STRL NON-REIN LRG LVL3 (GOWN DISPOSABLE) ×2 IMPLANT
GOWN STRL REIN XL XLG (GOWN DISPOSABLE) ×9 IMPLANT
HEMOSTAT SURGICEL 4X8 (HEMOSTASIS) IMPLANT
IV CATH 14GX2 1/4 (CATHETERS) ×3 IMPLANT
KIT BASIN OR (CUSTOM PROCEDURE TRAY) ×3 IMPLANT
NS IRRIG 1000ML POUR BTL (IV SOLUTION) ×3 IMPLANT
POUCH SPECIMEN RETRIEVAL 10MM (ENDOMECHANICALS) ×3 IMPLANT
SCISSORS LAP 5X35 DISP (ENDOMECHANICALS) ×3 IMPLANT
SET IRRIG TUBING LAPAROSCOPIC (IRRIGATION / IRRIGATOR) ×3 IMPLANT
SLEEVE XCEL OPT CAN 5 100 (ENDOMECHANICALS) ×2 IMPLANT
SLEEVE Z-THREAD 5X100MM (TROCAR) IMPLANT
SOLUTION ANTI FOG 6CC (MISCELLANEOUS) ×3 IMPLANT
STRIP CLOSURE SKIN 1/2X4 (GAUZE/BANDAGES/DRESSINGS) ×2 IMPLANT
SUT VIC AB 4-0 SH 18 (SUTURE) ×3 IMPLANT
SUT VICRYL 0 UR6 27IN ABS (SUTURE) ×1 IMPLANT
SYR 30ML LL (SYRINGE) ×3 IMPLANT
TOWEL OR 17X26 10 PK STRL BLUE (TOWEL DISPOSABLE) ×5 IMPLANT
TRAY LAP CHOLE (CUSTOM PROCEDURE TRAY) ×3 IMPLANT
TROCAR BLADELESS OPT 5 100 (ENDOMECHANICALS) ×1 IMPLANT
TROCAR BLADELESS OPT 5 75 (ENDOMECHANICALS) ×4 IMPLANT
TROCAR XCEL BLUNT TIP 100MML (ENDOMECHANICALS) ×2 IMPLANT
TROCAR XCEL NON-BLD 11X100MML (ENDOMECHANICALS) ×3 IMPLANT
TUBING INSUFFLATION 10FT LAP (TUBING) ×3 IMPLANT

## 2012-11-20 NOTE — Anesthesia Procedure Notes (Signed)
Procedure Name: Intubation Date/Time: 11/20/2012 11:02 AM Performed by: Edison Pace Pre-anesthesia Checklist: Patient identified, Timeout performed, Emergency Drugs available, Patient being monitored and Suction available Patient Re-evaluated:Patient Re-evaluated prior to inductionOxygen Delivery Method: Circle system utilized Preoxygenation: Pre-oxygenation with 100% oxygen Intubation Type: IV induction and Cricoid Pressure applied Ventilation: Mask ventilation without difficulty Laryngoscope Size: Mac and 4 Grade View: Grade II Tube type: Oral Tube size: 7.5 mm Number of attempts: 1 Airway Equipment and Method: Stylet Placement Confirmation: ETT inserted through vocal cords under direct vision,  breath sounds checked- equal and bilateral and positive ETCO2 Secured at: 21 cm Tube secured with: Tape Dental Injury: Teeth and Oropharynx as per pre-operative assessment

## 2012-11-20 NOTE — Anesthesia Preprocedure Evaluation (Addendum)
Anesthesia Evaluation  Patient identified by MRN, date of birth, ID band Patient awake    Reviewed: Allergy & Precautions, H&P , NPO status , Patient's Chart, lab work & pertinent test results  Airway Mallampati: II TM Distance: >3 FB Neck ROM: full    Dental no notable dental hx. (+) Teeth Intact and Dental Advisory Given   Pulmonary neg pulmonary ROS,  breath sounds clear to auscultation  Pulmonary exam normal       Cardiovascular Exercise Tolerance: Good negative cardio ROS  Rhythm:regular Rate:Normal     Neuro/Psych negative neurological ROS  negative psych ROS   GI/Hepatic negative GI ROS, Neg liver ROS,   Endo/Other  negative endocrine ROSMorbid obesity  Renal/GU negative Renal ROS  negative genitourinary   Musculoskeletal   Abdominal (+) + obese,   Peds  Hematology negative hematology ROS (+)   Anesthesia Other Findings   Reproductive/Obstetrics negative OB ROS                         Anesthesia Physical Anesthesia Plan  ASA: II  Anesthesia Plan: General   Post-op Pain Management:    Induction: Intravenous  Airway Management Planned: Oral ETT  Additional Equipment:   Intra-op Plan:   Post-operative Plan: Extubation in OR  Informed Consent: I have reviewed the patients History and Physical, chart, labs and discussed the procedure including the risks, benefits and alternatives for the proposed anesthesia with the patient or authorized representative who has indicated his/her understanding and acceptance.   Dental Advisory Given  Plan Discussed with: CRNA and Surgeon  Anesthesia Plan Comments:        Anesthesia Quick Evaluation

## 2012-11-20 NOTE — H&P (View-Only) (Signed)
Chief Complaint:  Recurrent right upper quadrant abdominal pain with known gallstones  History of Present Illness:  Vanessa Donaldson is an 32 y.o. female who is seen in the clinic today with a six-year history of abdominal pain which she describes beneath her right breast. This is nG department where CT scans have been done which showed gallstones but no other intra-abdominal processes. She's had this problem since she had a baby. She came seeking to have this problem addressed and have her gallbladder removed. I gave her a booklet on gallbladder surgery and explained this to her in some detail. She was distracted and at the time of our office encounter she was on the phone with people trying to fix her cell phone. Despite the distraction I was able to discuss this with her and will proceed toward cholecystectomy.  Past Medical History  Diagnosis Date  . Chronic back pain   . Drug abuse     marijuana  . Chlamydia   . Smoker   . Obesity   . Complication of anesthesia     perceived problem with epidural  . Chronic neck pain     Past Surgical History  Procedure Laterality Date  . Btl    . Tubal ligation      had a baby since BTL    Current Outpatient Prescriptions  Medication Sig Dispense Refill  . phentermine 15 MG capsule Take 15 mg by mouth every morning.      Marland Kitchen omeprazole (PRILOSEC) 20 MG capsule Take 1 capsule (20 mg total) by mouth daily.  30 capsule  0  . ondansetron (ZOFRAN) 4 MG tablet Take 1 tablet (4 mg total) by mouth every 6 (six) hours.  12 tablet  0   No current facility-administered medications for this visit.   Hydrocodone History reviewed. No pertinent family history. Social History:   reports that she has been smoking Cigarettes.  She has been smoking about 0.25 packs per day. She has never used smokeless tobacco. She reports that  drinks alcohol. She reports that she does not use illicit drugs.   REVIEW OF SYSTEMS - PERTINENT POSITIVES ONLY: Negative except for  right upper quadrant pain  Physical Exam:   Blood pressure 102/78, pulse 80, resp. rate 16, height 5\' 2"  (1.575 m), weight 240 lb 3.2 oz (108.954 kg). Body mass index is 43.92 kg/(m^2).  Gen:  WDWN AAF NAD  Neurological: Alert and oriented to person, place, and time. Motor and sensory function is grossly intact  Head: Normocephalic and atraumatic.  Eyes: Conjunctivae are normal. Pupils are equal, round, and reactive to light. No scleral icterus.  Neck: Normal range of motion. Neck supple. No tracheal deviation or thyromegaly present.  Cardiovascular:  SR without murmurs or gallops.  No carotid bruits Respiratory: Effort normal.  No respiratory distress. No chest wall tenderness. Breath sounds normal.  No wheezes, rales or rhonchi.  Abdomen:  Obese with pain in right upper quadrant but no rebound or guarding GU: Musculoskeletal: Normal range of motion. Extremities are nontender. No cyanosis, edema or clubbing noted Lymphadenopathy: No cervical, preauricular, postauricular or axillary adenopathy is present Skin: Skin is warm and dry. No rash noted. No diaphoresis. No erythema. No pallor. Pscyh: Normal mood and affect. Behavior is normal. Judgment and thought content normal.   LABORATORY RESULTS: No results found for this or any previous visit (from the past 48 hour(s)).  RADIOLOGY RESULTS: No results found.  Problem List: Patient Active Problem List   Diagnosis Date Noted  .  Gallstones 11/07/2012  . Chronic back pain 01/04/2011  . Drug abuse 01/04/2011  . History of chlamydia 01/04/2011  . Active smoker 01/04/2011  . Obesity 01/04/2011    Assessment & Plan: Gallstones with history of recurring visits to the ER for RUQ abdominal pain.  Will schedule for lap chole     Matt B. Daphine Deutscher, MD, [facility] [phone] beeper [phone]  11/07/2012 5:28 PM

## 2012-11-20 NOTE — Transfer of Care (Signed)
Immediate Anesthesia Transfer of Care Note  Patient: Vanessa Donaldson  Procedure(s) Performed: Procedure(s): LAPAROSCOPIC CHOLECYSTECTOMY WITH INTRAOPERATIVE CHOLANGIOGRAM (N/A) RECTAL EXAM UNDER ANESTHESIA  Patient Location: PACU  Anesthesia Type:General  Level of Consciousness: awake, alert , oriented, pateint uncooperative and responds to stimulation  Airway & Oxygen Therapy: Patient Spontanous Breathing and Patient connected to face mask oxygen  Post-op Assessment: Report given to PACU RN, Post -op Vital signs reviewed and stable and Patient moving all extremities  Post vital signs: Reviewed and stable  Complications: No apparent anesthesia complications

## 2012-11-20 NOTE — Interval H&P Note (Signed)
History and Physical Interval Note:  11/20/2012 10:01 AM  Vanessa Donaldson  has presented today for surgery, with the diagnosis of gall stones   The various methods of treatment have been discussed with the patient and family. After consideration of risks, benefits and other options for treatment, the patient has consented to  Procedure(s): LAPAROSCOPIC CHOLECYSTECTOMY WITH INTRAOPERATIVE CHOLANGIOGRAM (N/A) as a surgical intervention .  The patient's history has been reviewed, patient examined, no change in status, stable for surgery.  She has had persistant RUQ pain and I explained increased chances of open cholecystectomy and reviewed potential complications again including bile leaks, bile duct or bowel injuries and she undersands and accepts risks.   I have reviewed the patient's chart and labs.  Questions were answered to the patient's satisfaction.  Contact person is Jovita Kussmaul, her boyfriend.     Lenzy Kerschner B

## 2012-11-20 NOTE — Anesthesia Postprocedure Evaluation (Signed)
  Anesthesia Post-op Note  Patient: Vanessa Donaldson  Procedure(s) Performed: Procedure(s) (LRB): LAPAROSCOPIC CHOLECYSTECTOMY WITH INTRAOPERATIVE CHOLANGIOGRAM (N/A) RECTAL EXAM UNDER ANESTHESIA  Patient Location: PACU  Anesthesia Type: General  Level of Consciousness: awake and alert   Airway and Oxygen Therapy: Patient Spontanous Breathing  Post-op Pain: mild  Post-op Assessment: Post-op Vital signs reviewed, Patient's Cardiovascular Status Stable, Respiratory Function Stable, Patent Airway and No signs of Nausea or vomiting  Last Vitals:  Filed Vitals:   11/20/12 1315  BP: 99/58  Pulse: 54  Temp:   Resp: 10    Post-op Vital Signs: stable   Complications: No apparent anesthesia complications

## 2012-11-20 NOTE — Preoperative (Signed)
Beta Blockers   Reason not to administer Beta Blockers:Not Applicable 

## 2012-11-20 NOTE — Op Note (Signed)
Surgeon: Wenda Low, MD, FACS  Asst:  Lodema Pilot, DO  Anes:  general  Procedure: Lap chole with IOC  Diagnosis: Long GB packed with stones; normal IOC  Complications: None noted   EBL:   20 cc  Description of Procedure:  Taken to OR 1 and given general anesthesia.  I checked for impaction and none noted.  Prepped and draped.  Timeout performed.  Access to abdomen achieved with 5 mm Optiview through the right upper quadrant.  3 five mm trocars were used and an 11 mm in the upper midline provided access.  The GB was elevated and Calot's triangle was dissected free.  IOC performed which showed a long cystic duct, no stones, and good intrahepatic filling and flow in to the duodenum.  Cystic duct was triple clipped and divided.  Cystic artery was clipped and divided and the GB was removed from the bed without difficulty or spillage.  No bleeding or bile leaks were seen.  GB was taken out in a bag through the upper midline incision which had to be enlarged because the stone mass was about 2 cm in diameter.  Incissions were injected with marcaine and closed with vicryl and Dermabond.    Vanessa B. Daphine Deutscher, MD, [facility], [place] [phone]

## 2012-11-21 ENCOUNTER — Encounter (HOSPITAL_COMMUNITY): Payer: Self-pay | Admitting: Surgery

## 2012-11-21 ENCOUNTER — Telehealth (INDEPENDENT_AMBULATORY_CARE_PROVIDER_SITE_OTHER): Payer: Self-pay | Admitting: *Deleted

## 2012-11-21 NOTE — Telephone Encounter (Signed)
Patient called this morning to report she is having difficulty with pain management.  Patient states she was prescribed the Percocet to take 1 every 4 hours but states it hasn't helped so she has been taking 2 every 2 hours.  Explained to patient that she is taking way too much of the Percocet and really needs to take it as it was prescribed.  Explained to patient that how she is taking the Percocet would be considered overdosing on it and it can cause damage to other organs taking that much Percocet.  Patient became angry and stated "I'm in pain so it is what it is".  Explained to patient that I will page Dr. Daphine Deutscher to ask for his suggestions since the Percocet is typically the highest medication that we prescribe for this type of surgery.  Explained to patient that I will call her back when I hear from Dr. Daphine Deutscher.  Patient states understanding and agreeable at this time.

## 2012-11-21 NOTE — Telephone Encounter (Signed)
Called patient's telephone number but only got a voicemail that did not indicate the user.  Message was left to have patient give me a call back so I could discuss the message from Dr. Daphine Deutscher with the patient.

## 2012-11-21 NOTE — Telephone Encounter (Signed)
Spoke to Dr. Daphine Deutscher in the OR who stated that Percocet is a schedule II narcotic and is the strongest pain medication we write for.  He asks that I explain again to the patient that she needs to take the Percocet as prescribed and taking it any more often could cause damage to other organs due to the Tylenol.  He states patient needs to take the Percocet 1 every 4 hours and then Ibuprofen 600mg  or 800mg  every 8 hours alternating with the narcotic to help with pain.  Dr. Daphine Deutscher states if patient doesn't feel like this is enough then she needs to go to the ED for pain control but DO NOT take medications more frequently than prescribed.

## 2012-11-24 NOTE — Telephone Encounter (Signed)
Patient is stating she is constipated, she took laxatives before surgery and the doctor did not tell her she could continue to take them. Advised patient  continuous laxatives can be harsh to the body and the body can become dependant on the stimulant. She may want to eat green leafy vegs, increase her fluids, fruits such as prunes, figs apples , fiber cereals , stool softeners . mira lax as directed.  Patient is call if no results in the am .

## 2012-12-06 ENCOUNTER — Ambulatory Visit (INDEPENDENT_AMBULATORY_CARE_PROVIDER_SITE_OTHER): Payer: Medicaid Other | Admitting: Surgery

## 2012-12-06 ENCOUNTER — Encounter (INDEPENDENT_AMBULATORY_CARE_PROVIDER_SITE_OTHER): Payer: Self-pay | Admitting: Surgery

## 2012-12-06 VITALS — BP 120/72 | HR 68 | Temp 97.6°F | Resp 14 | Ht 62.0 in | Wt 237.8 lb

## 2012-12-06 DIAGNOSIS — R0789 Other chest pain: Secondary | ICD-10-CM

## 2012-12-06 DIAGNOSIS — R071 Chest pain on breathing: Secondary | ICD-10-CM

## 2012-12-06 MED ORDER — OXYCODONE-ACETAMINOPHEN 5-325 MG PO TABS: 1.0000 | ORAL_TABLET | ORAL | Status: DC | PRN

## 2012-12-06 NOTE — Progress Notes (Signed)
Vanessa Donaldson 32 y.o.  Body mass index is 43.48 kg/(m^2).  Patient Active Problem List   Diagnosis Date Noted  . S/P laparoscopic cholecystectomy August 2014 11/20/2012  . Chronic back pain 01/04/2011  . Drug abuse 01/04/2011  . History of chlamydia 01/04/2011  . Active smoker 01/04/2011  . Obesity 01/04/2011    Allergies  Allergen Reactions  . Hydrocodone Nausea Only and Other (See Comments)    Migraine headaches    Past Surgical History  Procedure Laterality Date  . Btl    . Tubal ligation      had a baby since BTL  . Cholecystectomy N/A 11/20/2012    Procedure: LAPAROSCOPIC CHOLECYSTECTOMY WITH INTRAOPERATIVE CHOLANGIOGRAM;  Surgeon: Valarie Merino, MD;  Location: [facility];  Service: General;  Laterality: N/A;  . Rectal exam under anesthesia  11/20/2012    Procedure: RECTAL EXAM UNDER ANESTHESIA;  Surgeon: Valarie Merino, MD;  Location: [facility];  Service: General;;   Jearld Lesch, MD 1. Right-sided chest wall pain     Doing well from her cholecystectomy. We discussed her constipation issues and I recommended weekly MiraLAX therapy to flush out. 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 She reports a lot of generalized muscle skeletal pain and has been receiving steroid injections by Dr. Mayford Knife on [address] for this. A print copies of her spinal series and also her CT abdomen pelvis. On that report not in the impression but in the body of the report for some evidence of bilateral hip osteoarthritis. A look at this didn't see it. Total intake the study results show this to Dr. Mayford Knife. I'm not certain whether steroid injections for the appropriate in a [place] woman with pain., Sounds like she could have fibromyalgia. Because the right chest wall pain short of referral or Percocet which granted for this time. Will see her back in for 5 weeks and see if this right chest wall mass is bothering the point where she wanted  excised. It seems to get pinched by her broad and causes chronic pain which he said sometimes really severe.  Return for 5 weeks Matt B. Daphine Deutscher, MD, [facility] [phone] beeper [phone]  12/06/2012 11:17 AM

## 2012-12-12 ENCOUNTER — Emergency Department (HOSPITAL_COMMUNITY)
Admission: EM | Admit: 2012-12-12 | Discharge: 2012-12-13 | Disposition: A | Discharge status: Home / Self Care | Payer: Medicaid Other | Attending: Emergency Medicine | Admitting: Emergency Medicine

## 2012-12-12 ENCOUNTER — Encounter (HOSPITAL_COMMUNITY): Payer: Self-pay | Admitting: Emergency Medicine

## 2012-12-12 DIAGNOSIS — Z9089 Acquired absence of other organs: Secondary | ICD-10-CM | POA: Insufficient documentation

## 2012-12-12 DIAGNOSIS — M542 Cervicalgia: Secondary | ICD-10-CM | POA: Insufficient documentation

## 2012-12-12 DIAGNOSIS — Z791 Long term (current) use of non-steroidal anti-inflammatories (NSAID): Secondary | ICD-10-CM | POA: Insufficient documentation

## 2012-12-12 DIAGNOSIS — M791 Myalgia, unspecified site: Secondary | ICD-10-CM

## 2012-12-12 DIAGNOSIS — R109 Unspecified abdominal pain: Secondary | ICD-10-CM

## 2012-12-12 DIAGNOSIS — K5900 Constipation, unspecified: Secondary | ICD-10-CM

## 2012-12-12 DIAGNOSIS — IMO0001 Reserved for inherently not codable concepts without codable children: Secondary | ICD-10-CM | POA: Insufficient documentation

## 2012-12-12 DIAGNOSIS — Z3202 Encounter for pregnancy test, result negative: Secondary | ICD-10-CM | POA: Insufficient documentation

## 2012-12-12 DIAGNOSIS — Z9851 Tubal ligation status: Secondary | ICD-10-CM | POA: Insufficient documentation

## 2012-12-12 DIAGNOSIS — R079 Chest pain, unspecified: Secondary | ICD-10-CM | POA: Insufficient documentation

## 2012-12-12 DIAGNOSIS — F17200 Nicotine dependence, unspecified, uncomplicated: Secondary | ICD-10-CM | POA: Insufficient documentation

## 2012-12-12 DIAGNOSIS — G8929 Other chronic pain: Secondary | ICD-10-CM

## 2012-12-12 DIAGNOSIS — R5381 Other malaise: Secondary | ICD-10-CM | POA: Insufficient documentation

## 2012-12-12 DIAGNOSIS — Z8619 Personal history of other infectious and parasitic diseases: Secondary | ICD-10-CM | POA: Insufficient documentation

## 2012-12-12 DIAGNOSIS — E669 Obesity, unspecified: Secondary | ICD-10-CM | POA: Insufficient documentation

## 2012-12-12 DIAGNOSIS — M549 Dorsalgia, unspecified: Secondary | ICD-10-CM | POA: Insufficient documentation

## 2012-12-12 HISTORY — DX: Calculus of gallbladder without cholecystitis without obstruction: K80.20

## 2012-12-12 NOTE — ED Notes (Signed)
Pt. reports generalized body aches /pain and abdominal pain for several days , denies nausea or vomitting .

## 2012-12-13 LAB — URINALYSIS, ROUTINE W REFLEX MICROSCOPIC
Hgb urine dipstick: NEGATIVE
Leukocytes, UA: NEGATIVE
Nitrite: NEGATIVE
Protein, ur: NEGATIVE mg/dL
Urobilinogen, UA: 0.2 mg/dL (ref 0.0–1.0)

## 2012-12-13 LAB — CBC WITH DIFFERENTIAL/PLATELET
Basophils Relative: 1 % (ref 0–1)
Eosinophils Absolute: 0.2 10*3/uL (ref 0.0–0.7)
Eosinophils Relative: 4 % (ref 0–5)
Hemoglobin: 13.8 g/dL (ref 12.0–15.0)
Lymphs Abs: 1.9 10*3/uL (ref 0.7–4.0)
MCH: 30.5 pg (ref 26.0–34.0)
MCHC: 35.8 g/dL (ref 30.0–36.0)
MCV: 85.2 fL (ref 78.0–100.0)
Monocytes Relative: 6 % (ref 3–12)
RBC: 4.53 MIL/uL (ref 3.87–5.11)

## 2012-12-13 LAB — LIPASE, BLOOD: Lipase: 32 U/L (ref 11–59)

## 2012-12-13 LAB — POCT PREGNANCY, URINE: Preg Test, Ur: NEGATIVE

## 2012-12-13 LAB — COMPREHENSIVE METABOLIC PANEL
BUN: 13 mg/dL (ref 6–23)
Calcium: 9.6 mg/dL (ref 8.4–10.5)
Creatinine, Ser: 0.65 mg/dL (ref 0.50–1.10)
GFR calc Af Amer: >90 mL/min (ref >90)
Glucose, Bld: 100 mg/dL — ABNORMAL HIGH (ref 70–99)
Total Protein: 7.3 g/dL (ref 6.0–8.3)

## 2012-12-13 MED ORDER — HYDROMORPHONE HCL PF 1 MG/ML IJ SOLN
0.5000 mg | Freq: Once | INTRAMUSCULAR | Status: AC
  Administered 2012-12-13: 0.5 mg via INTRAMUSCULAR
  Filled 2012-12-13: qty 1

## 2012-12-13 MED ORDER — POLYETHYLENE GLYCOL 3350 17 GM/SCOOP PO POWD: 17.0000 g | Freq: Every day | ORAL | Status: DC

## 2012-12-13 MED ORDER — OXYCODONE-ACETAMINOPHEN 5-325 MG PO TABS: 1.0000 | ORAL_TABLET | Freq: Four times a day (QID) | ORAL | Status: DC | PRN

## 2012-12-13 MED ORDER — KETOROLAC TROMETHAMINE 60 MG/2ML IM SOLN
60.0000 mg | Freq: Once | INTRAMUSCULAR | Status: AC
  Administered 2012-12-13: 60 mg via INTRAMUSCULAR
  Filled 2012-12-13: qty 2

## 2012-12-13 NOTE — ED Provider Notes (Signed)
CSN: [id number]     Arrival date & time 12/12/12  2321 History   First MD Initiated Contact with Patient 12/13/12 0103     Chief Complaint  Patient presents with  . Generalized Body Aches  . Abdominal Pain   (Consider location/radiation/quality/duration/timing/severity/associated sxs/prior Treatment) Patient is a 32 y.o. female presenting with abdominal pain. The history is provided by the patient.  Abdominal Pain Associated symptoms: chest pain, constipation, fatigue and nausea   Associated symptoms: no diarrhea, no dysuria, no shortness of breath and no vaginal bleeding    patient presents with multiple complaints. She states she's had pain in her neck, chest, extremities, and abdomen. She states his been going on for long time. She states his been worse over last couple days. She states there is some right-sided abdominal pain. No fevers. She had a recent cholecystectomy. She states she followup with her surgeon, Dr. Daphine Deutscher. He states that there was a lipoma on her chest that could be removed. She states she's had some constipation. She's had back pain neck pain. She states she's had pain everywhere since her epidural. No fevers. No weight loss. She states she's been told she may have fibromyalgia. She states she sees Dr. Mayford Knife but may need to see someone else. The abdominal pain is constant.  Past Medical History  Diagnosis Date  . Chronic back pain   . Drug abuse     marijuana  . Chlamydia   . Smoker   . Obesity   . Complication of anesthesia     perceived problem with epidural  . Chronic neck pain   . Gall stones    Past Surgical History  Procedure Laterality Date  . Btl    . Tubal ligation      had a baby since BTL  . Cholecystectomy N/A 11/20/2012    Procedure: LAPAROSCOPIC CHOLECYSTECTOMY WITH INTRAOPERATIVE CHOLANGIOGRAM;  Surgeon: Valarie Merino, MD;  Location: [facility];  Service: General;  Laterality: N/A;  . Rectal exam under anesthesia  11/20/2012    Procedure:  RECTAL EXAM UNDER ANESTHESIA;  Surgeon: Valarie Merino, MD;  Location: [facility];  Service: General;;   No family history on file. History  Substance Use Topics  . Smoking status: Current Every Day Smoker -- 0.25 packs/day for 8 years    Types: Cigarettes  . Smokeless tobacco: Never Used  . Alcohol Use: Yes     Comment: occasional   OB History   Grav Para Term Preterm Abortions TAB SAB Ect Mult Living   5 5 5       5      Review of Systems  Constitutional: Positive for fatigue. Negative for appetite change.  HENT: Positive for neck pain.   Respiratory: Negative for shortness of breath.   Cardiovascular: Positive for chest pain.  Gastrointestinal: Positive for nausea, abdominal pain and constipation. Negative for diarrhea.  Genitourinary: Negative for dysuria and vaginal bleeding.  Musculoskeletal: Positive for myalgias, back pain and arthralgias. Negative for gait problem.  Skin: Negative for pallor, rash and wound.  Neurological: Negative for syncope, weakness and light-headedness.  Psychiatric/Behavioral: Negative for confusion.    Allergies  Hydrocodone  Home Medications   Current Outpatient Rx  Name  Route  Sig  Dispense  Refill  . ibuprofen (ADVIL,MOTRIN) 800 MG tablet   Oral   Take 800 mg by mouth every 8 (eight) hours as needed for pain.         . naproxen sodium (ANAPROX) 220 MG tablet  Oral   Take 220 mg by mouth 2 (two) times daily with a meal.         . oxyCODONE-acetaminophen (ROXICET) 5-325 MG per tablet   Oral   Take 1 tablet by mouth every 4 (four) hours as needed for pain.   30 tablet   0   . oxyCODONE-acetaminophen (PERCOCET/ROXICET) 5-325 MG per tablet   Oral   Take 1-2 tablets by mouth every 6 (six) hours as needed for pain.   10 tablet   0   . polyethylene glycol powder (GLYCOLAX/MIRALAX) powder   Oral   Take 17 g by mouth daily.   255 g   0    BP 122/83  Pulse 99  Temp(Src) 98.1 F (36.7 C) (Oral)  Resp 18  SpO2 99%  LMP  10/20/2012 Physical Exam  Nursing note and vitals reviewed. Constitutional: She is oriented to person, place, and time. She appears well-developed and well-nourished.  HENT:  Head: Normocephalic and atraumatic.  Eyes: EOM are normal. Pupils are equal, round, and reactive to light.  Neck: Normal range of motion. Neck supple.  Cardiovascular: Normal rate, regular rhythm and normal heart sounds.   No murmur heard. Pulmonary/Chest: Effort normal and breath sounds normal. No respiratory distress. She has no wheezes. She has no rales.  Abdominal: Soft. Bowel sounds are normal. She exhibits no distension. There is tenderness. There is no rebound and no guarding.  Mild right-sided abdominal tenderness. No rebound or guarding. Well-healing incision.  Musculoskeletal: Normal range of motion.  Neurological: She is alert and oriented to person, place, and time. No cranial nerve deficit.  Skin: Skin is warm and dry.  Psychiatric: She has a normal mood and affect. Her speech is normal.    ED Course  Procedures (including critical care time) Labs Review Labs Reviewed  COMPREHENSIVE METABOLIC PANEL - Abnormal; Notable for the following:    Glucose, Bld 100 (*)    AST 79 (*)    ALT 62 (*)    All other components within normal limits  URINALYSIS, ROUTINE W REFLEX MICROSCOPIC - Abnormal; Notable for the following:    APPearance CLOUDY (*)    Specific Gravity, Urine 1.038 (*)    All other components within normal limits  CBC WITH DIFFERENTIAL  LIPASE, BLOOD  POCT PREGNANCY, URINE   Imaging Review No results found.  MDM   1. Abdominal pain   2. Myalgia   3. Chronic back pain   4. Constipation    Patient with multiple complaints. She's had pain for long time. No clear cause. Patient's LFTs are slightly above normal. Will need to be followed. She had intraoperative cholangiogram that was negative. Been able to find a clear cause of the patient's pain. She has seen multiple doctors for a  without a cause. She states she may see a new primary care Dr. Stann Mainland follow with PCP.    Juliet Rude. Rubin Payor, MD 12/13/12 2029

## 2012-12-13 NOTE — ED Notes (Signed)
MD aware of pt. Pain. Will come to reassess.

## 2012-12-20 ENCOUNTER — Encounter (INDEPENDENT_AMBULATORY_CARE_PROVIDER_SITE_OTHER): Payer: Self-pay | Admitting: Surgery

## 2012-12-20 ENCOUNTER — Ambulatory Visit (INDEPENDENT_AMBULATORY_CARE_PROVIDER_SITE_OTHER): Payer: Medicaid Other | Admitting: Surgery

## 2012-12-20 VITALS — BP 118/64 | HR 72 | Temp 96.8°F | Resp 14 | Ht 62.0 in | Wt 239.4 lb

## 2012-12-20 DIAGNOSIS — M79609 Pain in unspecified limb: Secondary | ICD-10-CM

## 2012-12-20 DIAGNOSIS — M79604 Pain in right leg: Secondary | ICD-10-CM

## 2012-12-20 MED ORDER — TRAMADOL HCL 50 MG PO TABS: 50.0000 mg | ORAL_TABLET | Freq: Four times a day (QID) | ORAL | Status: DC | PRN

## 2012-12-20 NOTE — Patient Instructions (Signed)
We will make a referral for a neuro-surgeon to see you for your pain

## 2012-12-21 ENCOUNTER — Other Ambulatory Visit (INDEPENDENT_AMBULATORY_CARE_PROVIDER_SITE_OTHER): Payer: Self-pay | Admitting: Surgery

## 2012-12-21 DIAGNOSIS — M25551 Pain in right hip: Secondary | ICD-10-CM

## 2012-12-22 NOTE — Progress Notes (Signed)
NAME: Vanessa Donaldson       DOB: 02-10-1981           DATE: 12/20/2012       WGN:[id number]  CC:  Chief Complaint  Patient presents with  . Follow-up    pt still complainin of pain in right side goin down her rt le    HPI: This patient underwent elective lap chole several weeks ago. Part of her preop symptoms was some right lateral chest wall pain. She has also had pain in the right hip raidating down her right leg. She was in the ED a week ago with ongoing issues with this type pain. She is having no GI symptoms and eating OK< bowels working, no nausea, no incisional pain.   Over the last week her leg pain is getting progressively worse. She notes is seems tocome from the right lower back and radiates down the lateral aspect of her right leg and donwn towards her foot. She has some associated numbeness but denies any weakness. She has in the past received som injections, apparently of steroids. She points to the ares of her sciatic nerve in her back as where the injections were placed. These were done by her primary care physician. She also has some lateral right lateral chest wall pain that is also chronic. It was felt that this was not related to her gallbladder disease and indeed has not chnged since cholecystectomy.  She has been told that she might have fibromyalgia, but that has not been an established diagnosis. She is frustrated that she is still having pain with no etiology known.  EXAM: Vital signs: BP 118/64  Pulse 72  Temp(Src) 96.8 F (36 C) (Temporal)  Resp 14  Ht 5\' 2"  (1.575 m)  Wt 239 lb 6.4 oz (108.591 kg)  BMI 43.78 kg/m2  General: Patient alert, oriented, NAD. She does seem quite anxious  Lungs; Clear Abd: Soft and completely benign. All incisions have healed. No suggestion of any post op issue Chest wall: Slight tenderness along the lateral chest wall at the bra line. Nothing to suggest infection, abscess etc. Ext: She seems to have n obvious weakness in the right leg,  no foot drop, ambulates OK today  IMP: No evidnece of post op complication. These symptoms, especially the chest wall issus, pre-date the surgery and are ongoing. Right leg radicular pain, which suggests some concern for sciatic type symtoms  PLAN: We will see if we can get NS consult to evaluate leg pain. I recommended that she also see someone for evaluation for possible fibromyalgia. I told her I see no issues related to her GB surgery. I gave her an Rx for Ultam till she can have consultations done  Tametria Aho J 12/22/2012

## 2013-01-17 ENCOUNTER — Encounter (INDEPENDENT_AMBULATORY_CARE_PROVIDER_SITE_OTHER): Payer: Medicaid Other | Admitting: Surgery

## 2013-02-01 ENCOUNTER — Other Ambulatory Visit: Payer: Self-pay

## 2013-02-13 ENCOUNTER — Other Ambulatory Visit (INDEPENDENT_AMBULATORY_CARE_PROVIDER_SITE_OTHER): Payer: Self-pay

## 2013-02-13 ENCOUNTER — Ambulatory Visit (INDEPENDENT_AMBULATORY_CARE_PROVIDER_SITE_OTHER): Payer: Medicaid Other | Admitting: Surgery

## 2013-02-13 VITALS — BP 132/80 | HR 78 | Temp 98.0°F | Resp 18 | Wt 250.0 lb

## 2013-02-13 DIAGNOSIS — R222 Localized swelling, mass and lump, trunk: Secondary | ICD-10-CM

## 2013-02-13 DIAGNOSIS — Z9889 Other specified postprocedural states: Secondary | ICD-10-CM

## 2013-02-13 DIAGNOSIS — Z9049 Acquired absence of other specified parts of digestive tract: Secondary | ICD-10-CM

## 2013-02-13 NOTE — Progress Notes (Signed)
Vanessa Donaldson 32 y.o.  Body mass index is 45.71 kg/(m^2).  Patient Active Problem List   Diagnosis Date Noted  . S/P laparoscopic cholecystectomy August 2014 11/20/2012  . Chronic back pain 01/04/2011  . Drug abuse 01/04/2011  . History of chlamydia 01/04/2011  . Active smoker 01/04/2011  . Obesity 01/04/2011    Allergies  Allergen Reactions  . Hydrocodone Nausea Only and Other (See Comments)    Migraine headaches    Past Surgical History  Procedure Laterality Date  . Btl    . Tubal ligation      had a baby since BTL  . Cholecystectomy N/A 11/20/2012    Procedure: LAPAROSCOPIC CHOLECYSTECTOMY WITH INTRAOPERATIVE CHOLANGIOGRAM;  Surgeon: Valarie Merino, MD;  Location: [facility];  Service: General;  Laterality: N/A;  . Rectal exam under anesthesia  11/20/2012    Procedure: RECTAL EXAM UNDER ANESTHESIA;  Surgeon: Valarie Merino, MD;  Location: [facility];  Service: General;;   Jearld Lesch, MD No diagnosis found.  Still complaining of the right lateral chest wall pain.  May be a lipoma there but....will get ultrasound of this area.  She has an aunt and grandmother with breast cancer.  She has never be studied.  Will check those before moving ahead with removal of mass.  It needs to be better defined.    Matt B. Daphine Deutscher, MD, [facility] [phone] beeper [phone]  02/13/2013 2:30 PM

## 2013-02-14 ENCOUNTER — Telehealth (INDEPENDENT_AMBULATORY_CARE_PROVIDER_SITE_OTHER): Payer: Self-pay | Admitting: *Deleted

## 2013-02-14 NOTE — Telephone Encounter (Signed)
I called pt to inform her of her appt at [facility] for her MM and [place] on 02/21/13 with an arrival time of 3:15pm.  I provided her with their address and phone number.  Pt is agreeable.

## 2013-02-19 ENCOUNTER — Other Ambulatory Visit (INDEPENDENT_AMBULATORY_CARE_PROVIDER_SITE_OTHER): Payer: Self-pay | Admitting: Surgery

## 2013-02-19 DIAGNOSIS — N6310 Unspecified lump in the right breast, unspecified quadrant: Secondary | ICD-10-CM

## 2013-02-21 ENCOUNTER — Ambulatory Visit
Admission: RE | Admit: 2013-02-21 | Discharge: 2013-02-21 | Disposition: A | Discharge status: Home / Self Care | Payer: Medicaid Other | Source: Ambulatory Visit | Attending: Surgery | Admitting: Surgery

## 2013-02-21 ENCOUNTER — Encounter (HOSPITAL_COMMUNITY): Payer: Self-pay | Admitting: Emergency Medicine

## 2013-02-21 ENCOUNTER — Emergency Department (HOSPITAL_COMMUNITY)
Admission: EM | Admit: 2013-02-21 | Discharge: 2013-02-21 | Disposition: A | Discharge status: Home / Self Care | Payer: Medicaid Other | Attending: Emergency Medicine | Admitting: Emergency Medicine

## 2013-02-21 ENCOUNTER — Emergency Department (HOSPITAL_COMMUNITY): Payer: Medicaid Other

## 2013-02-21 DIAGNOSIS — Z8659 Personal history of other mental and behavioral disorders: Secondary | ICD-10-CM | POA: Insufficient documentation

## 2013-02-21 DIAGNOSIS — E669 Obesity, unspecified: Secondary | ICD-10-CM | POA: Insufficient documentation

## 2013-02-21 DIAGNOSIS — M329 Systemic lupus erythematosus, unspecified: Secondary | ICD-10-CM | POA: Insufficient documentation

## 2013-02-21 DIAGNOSIS — K5900 Constipation, unspecified: Secondary | ICD-10-CM | POA: Insufficient documentation

## 2013-02-21 DIAGNOSIS — N6310 Unspecified lump in the right breast, unspecified quadrant: Secondary | ICD-10-CM

## 2013-02-21 DIAGNOSIS — Z8619 Personal history of other infectious and parasitic diseases: Secondary | ICD-10-CM | POA: Insufficient documentation

## 2013-02-21 DIAGNOSIS — F17200 Nicotine dependence, unspecified, uncomplicated: Secondary | ICD-10-CM | POA: Insufficient documentation

## 2013-02-21 DIAGNOSIS — M546 Pain in thoracic spine: Secondary | ICD-10-CM | POA: Insufficient documentation

## 2013-02-21 DIAGNOSIS — Z79899 Other long term (current) drug therapy: Secondary | ICD-10-CM | POA: Insufficient documentation

## 2013-02-21 DIAGNOSIS — R079 Chest pain, unspecified: Secondary | ICD-10-CM | POA: Insufficient documentation

## 2013-02-21 DIAGNOSIS — G8929 Other chronic pain: Secondary | ICD-10-CM | POA: Insufficient documentation

## 2013-02-21 DIAGNOSIS — K921 Melena: Secondary | ICD-10-CM | POA: Insufficient documentation

## 2013-02-21 DIAGNOSIS — Z791 Long term (current) use of non-steroidal anti-inflammatories (NSAID): Secondary | ICD-10-CM | POA: Insufficient documentation

## 2013-02-21 DIAGNOSIS — R0781 Pleurodynia: Secondary | ICD-10-CM

## 2013-02-21 MED ORDER — DIAZEPAM 5 MG PO TABS
5.0000 mg | ORAL_TABLET | Freq: Once | ORAL | Status: AC
  Administered 2013-02-21: 5 mg via ORAL
  Filled 2013-02-21: qty 1

## 2013-02-21 MED ORDER — HYDROMORPHONE HCL PF 1 MG/ML IJ SOLN
1.0000 mg | Freq: Once | INTRAMUSCULAR | Status: AC
  Administered 2013-02-21: 1 mg via INTRAMUSCULAR
  Filled 2013-02-21: qty 1

## 2013-02-21 MED ORDER — DIAZEPAM 5 MG PO TABS: 5.0000 mg | ORAL_TABLET | Freq: Two times a day (BID) | ORAL | Status: DC

## 2013-02-21 MED ORDER — OXYCODONE-ACETAMINOPHEN 5-325 MG PO TABS: 2.0000 | ORAL_TABLET | ORAL | Status: DC | PRN

## 2013-02-21 NOTE — ED Notes (Signed)
Pt c/o left lateral rib and left flank pain. Onset 2 days ago. Denies injury. Denies N/V. Denies cough. Pain worse with movement.

## 2013-02-21 NOTE — ED Provider Notes (Signed)
Medical screening examination/treatment/procedure(s) were performed by non-physician practitioner and as supervising physician I was immediately available for consultation/collaboration.  EKG Interpretation   None         Ileen Kahre M Ikey Omary, MD 02/21/13 1659 

## 2013-02-21 NOTE — ED Notes (Signed)
Pt sts mid back around to left rib pain with inspiration and cough x 3 days

## 2013-02-21 NOTE — ED Provider Notes (Signed)
CSN: [id number]     Arrival date & time 02/21/13  0921 History  This chart was scribed for non-physician practitioner, Irish Elders, FNP,working with Lyanne Co, MD, by Karle Plumber, ED Scribe.  This patient was seen in room TR07C/TR07C and the patient's care was started at 9:37 AM.  Chief Complaint  Patient presents with  . rib pain   . Back Pain   Patient is a 32 y.o. female presenting with back pain. The history is provided by the patient. No language interpreter was used.  Back Pain Location:  Thoracic spine Radiates to: posterior left rib pain. Pain severity:  Severe Pain is:  Same all the time Timing:  Constant Progression:  Worsening Chronicity:  Recurrent Relieved by:  Nothing Worsened by:  Deep breathing and movement Ineffective treatments:  Ibuprofen, NSAIDs, muscle relaxants and narcotics Associated symptoms: no abdominal pain, no abdominal swelling, no chest pain, no fever, no tingling and no weight loss   Risk factors: lack of exercise    HPI Comments:  Vanessa Donaldson is a 32 y.o. female with h/o cholecystectomy who presents to the Emergency Department complaining of worsening, constant, severe mid back pain that radiates to her left-sided ribs for approximately two days. She reports associated constipation. Pt states she has had Icy Hot applied around 7 AM or 8 AM with no relief. Pt states she has an appt at the [facility] today for a mammogram secondary to having chronic breast pain. Pt states she has been diagnosed with Lupus and was referred to a rheumatologist. She states she was given Zanaflex, but denies relief from the medication. She states she has been given Morphine and Dilaudid and it has not successfully helped her pain in the past for more than an hour or two. Pt denies any fever, cough, SOB, nausea, vomiting, or diarrhea.   Past Medical History  Diagnosis Date  . Chronic back pain   . Drug abuse     marijuana  . Chlamydia   . Smoker   . Obesity    . Complication of anesthesia     perceived problem with epidural  . Chronic neck pain   . Gall stones    Past Surgical History  Procedure Laterality Date  . Btl    . Tubal ligation      had a baby since BTL  . Cholecystectomy N/A 11/20/2012    Procedure: LAPAROSCOPIC CHOLECYSTECTOMY WITH INTRAOPERATIVE CHOLANGIOGRAM;  Surgeon: Valarie Merino, MD;  Location: [facility];  Service: General;  Laterality: N/A;  . Rectal exam under anesthesia  11/20/2012    Procedure: RECTAL EXAM UNDER ANESTHESIA;  Surgeon: Valarie Merino, MD;  Location: [facility];  Service: General;;   History reviewed. No pertinent family history. History  Substance Use Topics  . Smoking status: Current Every Day Smoker -- 0.25 packs/day for 8 years    Types: Cigarettes  . Smokeless tobacco: Never Used  . Alcohol Use: Yes     Comment: occasional   OB History   Grav Para Term Preterm Abortions TAB SAB Ect Mult Living   5 5 5       5      Review of Systems  Constitutional: Negative for fever and weight loss.  Respiratory: Negative for cough and shortness of breath.   Cardiovascular: Negative for chest pain.  Gastrointestinal: Positive for constipation and blood in stool (secondary to taking excess amount of NSAIDs). Negative for nausea, vomiting, abdominal pain and diarrhea.  Musculoskeletal: Positive for arthralgias (  left rib pain with inspiration) and back pain.  Neurological: Negative for tingling.  All other systems reviewed and are negative.    Allergies  Hydrocodone  Home Medications   Current Outpatient Rx  Name  Route  Sig  Dispense  Refill  . ibuprofen (ADVIL,MOTRIN) 800 MG tablet   Oral   Take 800 mg by mouth every 8 (eight) hours as needed for pain.         . naproxen sodium (ANAPROX) 220 MG tablet   Oral   Take 220 mg by mouth 2 (two) times daily with a meal.         . polyethylene glycol powder (GLYCOLAX/MIRALAX) powder   Oral   Take 17 g by mouth daily.   255 g   0   . traMADol  (ULTRAM) 50 MG tablet   Oral   Take 1 tablet (50 mg total) by mouth every 6 (six) hours as needed for pain.   30 tablet   1    Triage Vitals: BP 106/67  Pulse 71  Temp(Src) 98.3 F (36.8 C) (Oral)  SpO2 99% Physical Exam  Nursing note and vitals reviewed. Constitutional: She is oriented to person, place, and time. She appears well-developed and well-nourished.  Obese  HENT:  Head: Normocephalic and atraumatic.  Neck: Neck supple.  Cardiovascular: Normal rate, regular rhythm and normal heart sounds.   Pulmonary/Chest: Effort normal and breath sounds normal. No respiratory distress. She has no wheezes. She has no rales. She exhibits no tenderness.  Musculoskeletal: Normal range of motion. She exhibits no edema.  Left posterior rib pain.  Neurological: She is alert and oriented to person, place, and time. No cranial nerve deficit.  Psychiatric: She has a normal mood and affect. Her behavior is normal.    ED Course  Procedures (including critical care time) DIAGNOSTIC STUDIES: Oxygen Saturation is 99% on RA, normal by my interpretation.   COORDINATION OF CARE: 9:47 AM- Will perform an X-Ray. Will give Dilaudid and Valium here in the ED. Pt verbalizes understanding and agrees to plan.  Medications  diazepam (VALIUM) tablet 5 mg (not administered)  HYDROmorphone (DILAUDID) injection 1 mg (not administered)   Labs Review Labs Reviewed - No data to display Imaging Review No results found.  EKG Interpretation   None       MDM   1. Rib pain on left side    Well-appearing, has had previous negative workup. Normal chest x-ray. Afebrile, breath sounds clear bilaterally. No cough, wheezing, shortness of breath or difficulty breathing. VS stable. Has an appt today at [facility] for mammogram with [place]. May be referred pain from breasts.   I personally performed the services described in this documentation, which was scribed in my presence. The recorded information has been reviewed  and is accurate.      Irish Elders, NP 02/21/13 1032

## 2013-03-25 ENCOUNTER — Encounter (HOSPITAL_COMMUNITY): Payer: Self-pay | Admitting: Emergency Medicine

## 2013-03-25 ENCOUNTER — Emergency Department (HOSPITAL_COMMUNITY)
Admission: EM | Admit: 2013-03-25 | Discharge: 2013-03-25 | Discharge status: Against Medical Advice | Payer: Medicaid Other | Attending: Emergency Medicine | Admitting: Emergency Medicine

## 2013-03-25 DIAGNOSIS — R1084 Generalized abdominal pain: Secondary | ICD-10-CM | POA: Insufficient documentation

## 2013-03-25 DIAGNOSIS — Z3202 Encounter for pregnancy test, result negative: Secondary | ICD-10-CM | POA: Insufficient documentation

## 2013-03-25 DIAGNOSIS — F17200 Nicotine dependence, unspecified, uncomplicated: Secondary | ICD-10-CM | POA: Insufficient documentation

## 2013-03-25 DIAGNOSIS — E669 Obesity, unspecified: Secondary | ICD-10-CM | POA: Insufficient documentation

## 2013-03-25 LAB — COMPREHENSIVE METABOLIC PANEL
ALT: 8 U/L (ref 0–35)
Albumin: 3.6 g/dL (ref 3.5–5.2)
Alkaline Phosphatase: 51 U/L (ref 39–117)
Calcium: 9.3 mg/dL (ref 8.4–10.5)
Chloride: 102 mEq/L (ref 96–112)
GFR calc Af Amer: >90 mL/min (ref >90)
Glucose, Bld: 100 mg/dL — ABNORMAL HIGH (ref 70–99)
Potassium: 3.8 mEq/L (ref 3.5–5.1)
Sodium: 139 mEq/L (ref 135–145)
Total Protein: 7 g/dL (ref 6.0–8.3)

## 2013-03-25 LAB — CBC WITH DIFFERENTIAL/PLATELET
Basophils Absolute: 0 10*3/uL (ref 0.0–0.1)
Basophils Relative: 0 % (ref 0–1)
Eosinophils Absolute: 0.2 10*3/uL (ref 0.0–0.7)
Eosinophils Relative: 2 % (ref 0–5)
Hemoglobin: 13.9 g/dL (ref 12.0–15.0)
Lymphs Abs: 2.4 10*3/uL (ref 0.7–4.0)
MCH: 30.4 pg (ref 26.0–34.0)
MCHC: 34.8 g/dL (ref 30.0–36.0)
Neutrophils Relative %: 65 % (ref 43–77)
Platelets: 250 10*3/uL (ref 150–400)
RBC: 4.57 MIL/uL (ref 3.87–5.11)
RDW: 12.8 % (ref 11.5–15.5)

## 2013-03-25 LAB — URINALYSIS, ROUTINE W REFLEX MICROSCOPIC
Bilirubin Urine: NEGATIVE
Ketones, ur: NEGATIVE mg/dL
Nitrite: NEGATIVE
Protein, ur: NEGATIVE mg/dL
Specific Gravity, Urine: 1.024 (ref 1.005–1.030)
Urobilinogen, UA: 0.2 mg/dL (ref 0.0–1.0)

## 2013-03-25 NOTE — ED Notes (Signed)
Pt c/o generalized abd pain to right side x 2 days; pt sts some nausea and LMP was 2 weeks ago

## 2013-03-25 NOTE — ED Notes (Signed)
Pt did not answer when called x3 

## 2013-04-18 ENCOUNTER — Telehealth (INDEPENDENT_AMBULATORY_CARE_PROVIDER_SITE_OTHER): Payer: Self-pay | Admitting: General Surgery

## 2013-04-18 NOTE — Telephone Encounter (Signed)
Pt of Dr. Daphine DeutscherMartin called to request appt.  She had GB surgery August 2014 and having pain on Rt side again, sometimes quite intense.  Made appt and left message on unidentified VM to call me for specifics.  Also, placed her on the Wait List to move up if an opening comes up before then.

## 2013-04-26 ENCOUNTER — Encounter (INDEPENDENT_AMBULATORY_CARE_PROVIDER_SITE_OTHER): Payer: Self-pay | Admitting: Surgery

## 2013-04-26 ENCOUNTER — Ambulatory Visit (INDEPENDENT_AMBULATORY_CARE_PROVIDER_SITE_OTHER): Payer: Medicaid Other | Admitting: Surgery

## 2013-04-26 VITALS — BP 118/80 | HR 76 | Temp 97.9°F | Resp 14 | Ht 62.0 in | Wt 254.6 lb

## 2013-04-26 DIAGNOSIS — R0789 Other chest pain: Secondary | ICD-10-CM

## 2013-04-26 DIAGNOSIS — R071 Chest pain on breathing: Secondary | ICD-10-CM

## 2013-04-26 NOTE — Patient Instructions (Addendum)
Call [phone] to schedule an appointment at the weight loss seminar.     Fibromyalgia Fibromyalgia is a disorder that is often misunderstood. It is associated with muscular pains and tenderness that comes and goes. It is often associated with fatigue and sleep disturbances. Though it tends to be long-lasting, fibromyalgia is not life-threatening. CAUSES  The exact cause of fibromyalgia is unknown. People with certain gene types are predisposed to developing fibromyalgia and other conditions. Certain factors can play a role as triggers, such as:  Spine disorders.  Arthritis.  Severe injury (trauma) and other physical stressors.  Emotional stressors. SYMPTOMS   The main symptom is pain and stiffness in the muscles and joints, which can vary over time.  Sleep and fatigue problems. Other related symptoms may include:  Bowel and bladder problems.  Headaches.  Visual problems.  Problems with odors and noises.  Depression or mood changes.  Painful periods (dysmenorrhea).  Dryness of the skin or eyes. DIAGNOSIS  There are no specific tests for diagnosing fibromyalgia. Patients can be diagnosed accurately from the specific symptoms they have. The diagnosis is made by determining that nothing else is causing the problems. TREATMENT  There is no cure. Management includes medicines and an active, healthy lifestyle. The goal is to enhance physical fitness, decrease pain, and improve sleep. HOME CARE INSTRUCTIONS   Only take over-the-counter or prescription medicines as directed by your caregiver. Sleeping pills, tranquilizers, and pain medicines may make your problems worse.  Low-impact aerobic exercise is very important and advised for treatment. At first, it may seem to make pain worse. Gradually increasing your tolerance will overcome this feeling.  Learning relaxation techniques and how to control stress will help you. Biofeedback, visual imagery, hypnosis, muscle relaxation,  yoga, and meditation are all options.  Anti-inflammatory medicines and physical therapy may provide short-term help.  Acupuncture or massage treatments may help.  Take muscle relaxant medicines as suggested by your caregiver.  Avoid stressful situations.  Plan a healthy lifestyle. This includes your diet, sleep, rest, exercise, and friends.  Find and practice a hobby you enjoy.  Join a fibromyalgia support group for interaction, ideas, and sharing advice. This may be helpful. SEEK MEDICAL CARE IF:  You are not having good results or improvement from your treatment. FOR MORE INFORMATION  National Fibromyalgia Association: www.fmaware.org Arthritis Foundation: www.arthritis.org Document Released: 03/15/2005 Document Revised: 06/07/2011 Document Reviewed: 06/25/2009 [facility] Patient Information 2014 [place], [place].

## 2013-04-26 NOTE — Progress Notes (Signed)
Vanessa Donaldson 33 y.o.  Body mass index is 46.56 kg/(m^2).  Patient Active Problem List   Diagnosis Date Noted  . S/P laparoscopic cholecystectomy August 2014 11/20/2012  . Chronic back pain 01/04/2011  . Drug abuse 01/04/2011  . History of chlamydia 01/04/2011  . Active smoker 01/04/2011  . Obesity 01/04/2011    Allergies  Allergen Reactions  . Hydrocodone Nausea Only and Other (See Comments)    Migraine headaches    Past Surgical History  Procedure Laterality Date  . Btl    . Tubal ligation      had a baby since BTL  . Cholecystectomy N/A 11/20/2012    Procedure: LAPAROSCOPIC CHOLECYSTECTOMY WITH INTRAOPERATIVE CHOLANGIOGRAM;  Surgeon: Valarie MerinoMatthew B Tasha Diaz, MD;  Location: [facility];  Service: General;  Laterality: N/A;  . Rectal exam under anesthesia  11/20/2012    Procedure: RECTAL EXAM UNDER ANESTHESIA;  Surgeon: Valarie MerinoMatthew B Octavie Westerhold, MD;  Location: [facility];  Service: General;;   Jearld LeschWILLIAMS,DWIGHT M, MD No diagnosis found.  When she call for this  Apt is complaining of pain in her right flank going down into her groin. This is resolved but she is back complaining of this pain in her right chest. It is beneath her bra line on the right lateral chest where she thought she had a lump before. I could never feel anything but because of the chronicity of this pain I think we should do a CT scan of her chest nature there is not something between her ribs that is producing it.    I will see her following the CT to review it.  She is also interested in bariatric surgery and I have referred her to attend our seminar.  [phone]) Matt B. Daphine DeutscherMartin, MD, [facility]  [facility] [phone] beeper [phone]  04/26/2013 12:45 PM

## 2013-04-27 ENCOUNTER — Telehealth (INDEPENDENT_AMBULATORY_CARE_PROVIDER_SITE_OTHER): Payer: Self-pay | Admitting: *Deleted

## 2013-04-27 NOTE — Telephone Encounter (Signed)
Pt called and was given information regarding her CT scan.

## 2013-04-27 NOTE — Telephone Encounter (Signed)
LMOM for pt to return my call.  I was calling to notify pt of the appt for her CT scan at [facility] on 05/09/13 with an arrival time of 1:45pm.  Pt is NOT to have any solid foods 4 hours prior to scan, only liquids.

## 2013-05-09 ENCOUNTER — Other Ambulatory Visit: Payer: Medicaid Other

## 2013-05-28 ENCOUNTER — Encounter (HOSPITAL_COMMUNITY): Payer: Self-pay | Admitting: Emergency Medicine

## 2013-05-28 NOTE — ED Notes (Signed)
Pt here with c/o R sided flank/chest pain. Pt states that she had her gallbladder removed 2 months ago and since then she has had persistent pain under R arm/ along ribcage. Pt also states that she has R sided frontal HA and occasionally has numbness and tingling in her R arm. No meds PTA.

## 2013-05-28 NOTE — ED Notes (Signed)
Pt remains in peds with her children.

## 2013-05-28 NOTE — ED Notes (Signed)
Pt in Peds with her children at this time.

## 2013-06-22 ENCOUNTER — Emergency Department (INDEPENDENT_AMBULATORY_CARE_PROVIDER_SITE_OTHER)
Admission: EM | Admit: 2013-06-22 | Discharge: 2013-06-22 | Disposition: A | Discharge status: Home / Self Care | Payer: Medicaid Other | Source: Home / Self Care | Attending: Emergency Medicine | Admitting: Emergency Medicine

## 2013-06-22 ENCOUNTER — Encounter (HOSPITAL_COMMUNITY): Payer: Self-pay | Admitting: Emergency Medicine

## 2013-06-22 ENCOUNTER — Emergency Department (INDEPENDENT_AMBULATORY_CARE_PROVIDER_SITE_OTHER): Payer: Medicaid Other

## 2013-06-22 DIAGNOSIS — R0789 Other chest pain: Secondary | ICD-10-CM

## 2013-06-22 MED ORDER — CYCLOBENZAPRINE HCL 5 MG PO TABS: 5.0000 mg | ORAL_TABLET | Freq: Three times a day (TID) | ORAL | Status: DC | PRN

## 2013-06-22 MED ORDER — PREDNISONE 20 MG PO TABS: ORAL_TABLET | ORAL | Status: DC

## 2013-06-22 MED ORDER — KETOROLAC TROMETHAMINE 60 MG/2ML IM SOLN
60.0000 mg | Freq: Once | INTRAMUSCULAR | Status: AC
  Administered 2013-06-22: 60 mg via INTRAMUSCULAR

## 2013-06-22 MED ORDER — GABAPENTIN 300 MG PO CAPS: ORAL_CAPSULE | ORAL | Status: DC

## 2013-06-22 MED ORDER — METHYLPREDNISOLONE ACETATE 80 MG/ML IJ SUSP
INTRAMUSCULAR | Status: AC
  Filled 2013-06-22: qty 1

## 2013-06-22 MED ORDER — METHYLPREDNISOLONE ACETATE 80 MG/ML IJ SUSP
80.0000 mg | Freq: Once | INTRAMUSCULAR | Status: AC
  Administered 2013-06-22: 80 mg via INTRAMUSCULAR

## 2013-06-22 MED ORDER — KETOROLAC TROMETHAMINE 60 MG/2ML IM SOLN
INTRAMUSCULAR | Status: AC
  Filled 2013-06-22: qty 2

## 2013-06-22 NOTE — ED Provider Notes (Signed)
Chief Complaint   Chief Complaint  Patient presents with  . Chest Pain    History of Present Illness    Vanessa Donaldson is a 33 year old female who has had a one year plus history of pain in her right lateral chest wall area. The pain is sharp and constant. It is better if she gets a massage. She has been to the emergency room multiple times with this pain. She had a CT scan of her abdomen which showed gallstones. She had a cholecystectomy in December but does not feel any better. She tried Advil and Aleve without results. The pain is nonpleuritic. It hurts to move, to bend, or stiffness. She's had some pain in her back and also in her neck and lumbar spine as well. She has many other symptoms including generalized myalgias and stiffness, abdominal pain, constipation, and weight gain. She denies any injury to this area.  Review of Systems    Other than noted above, the patient denies any of the following symptoms. Systemic:  No fever or chills. Pulmonary:  No cough, wheezing, shortness of breath, sputum production, hemoptysis. Cardiac:  No palpitations, rapid heartbeat, dizziness, presyncope or syncope. GI:  No abdominal pain, heartburn, nausea, or vomiting. Ext:  No leg pain or swelling.  PMFSH    Past medical history, family history, social history, meds, and allergies were reviewed.   Physical Exam     Vital signs:  BP 99/67  Pulse 85  Temp(Src) 98.8 F (37.1 C) (Oral)  Resp 18  SpO2 99%  LMP 06/15/2013 Gen:  Alert, oriented, in no distress, skin warm and dry. Eye:  PERRL, lids and conjunctivas normal.  Sclera non-icteric. ENT:  Mucous membranes moist, pharynx clear. Neck:  Supple, no adenopathy or tenderness.  No JVD. Lungs:  Clear to auscultation, no wheezes, rales or rhonchi.  No respiratory distress. Heart:  Regular rhythm.  No gallops, murmers, clicks or rubs. Chest:  There is tenderness to palpation over the right lateral chest wall area. Abdomen:  Soft, nontender, no  organomegaly or mass.  Bowel sounds normal.  No pulsatile abdominal mass or bruit. Ext:  No edema.  No calf tenderness and Homann's sign negative.  Pulses full and equal. Skin:  Warm and dry.  No rash.  Radiology     Dg Ribs Unilateral W/chest Right  06/22/2013   CLINICAL DATA:  Right rib pain  EXAM: RIGHT RIBS AND CHEST - 3+ VIEW  COMPARISON:  Chest radiographs dated 02/21/2013  FINDINGS: Lungs are clear.  No pleural effusion or pneumothorax.  The heart is normal in size  No displaced right rib fracture is seen.  IMPRESSION: No evidence of acute cardiopulmonary disease.  No displaced right rib fracture is seen.   Electronically Signed   By: Charline Bills M.D.   On: 06/22/2013 18:04   I reviewed the images independently and personally and concur with the radiologist's findings.     Course in Urgent Care Center   Given Toradol 60 mg IM and Depo-Medrol 80 mg IM.  Assessment     The encounter diagnosis was Musculoskeletal chest pain.  Differential diagnosis includes costochondritis, muscle strain, fibromyalgia, or thoracic spine radiculopathy. I suggested she followup with a neurosurgeon and suggested she may want to consider an MRI of the thoracic spine.  Plan     1.  Meds:  The following meds were prescribed:   Discharge Medication List as of 06/22/2013  6:19 PM    START taking these medications   Details  cyclobenzaprine (FLEXERIL) 5 MG tablet Take 1 tablet (5 mg total) by mouth 3 (three) times daily as needed for muscle spasms., Starting 06/22/2013, Until Discontinued, Normal    gabapentin (NEURONTIN) 300 MG capsule Take 1 daily for 3 days, 1 BID for 3 days, then 1 TID, Normal    predniSONE (DELTASONE) 20 MG tablet Take 3 daily for 5 days, 2 daily for 5 days, 1 daily for 5 days., Normal        2.  Patient Education/Counseling:  The patient was given  appropriate handouts, self care instructions, and instructed in symptomatic relief.    3.  Follow up:  The patient was told to follow up here if no better in 3 to 4 days, or sooner if becoming worse in any way, and give an an some red flag symptoms such as worsening pain, shortness of breath, dizziness, or passing out which would prompt immediate return. Followup with Dr. Tressie StalkerJeffrey Jenkins.     Reuben Likesavid C Brennen Gardiner, MD 06/22/13 2129

## 2013-06-22 NOTE — ED Notes (Signed)
Patient c/o right side, mid back pain .  Reports pain for 6 months .  Reports pain every day, constant, never stops with the exception of when pressure is applied to area.

## 2013-06-22 NOTE — Discharge Instructions (Signed)

## 2013-06-26 NOTE — ED Notes (Signed)
Returned pt's call... C/o facial numbness due to meds rx; sxs have resolved and doing much better.... AB, RN

## 2013-08-21 ENCOUNTER — Telehealth: Payer: Self-pay | Admitting: Neurology

## 2013-08-21 NOTE — Telephone Encounter (Signed)
Left message for patient to call back and schedule follow up appointment with Dr. Vickey Huger per patient's request.

## 2013-08-22 ENCOUNTER — Telehealth: Payer: Self-pay | Admitting: Neurology

## 2013-08-22 NOTE — Telephone Encounter (Signed)
Patient calling to state that she wants sooner appointment than the first available with Dr. Vickey Huger in September. Patient states she thinks she might have a pinched nerve somewhere on her back and medication isn't helping with it. Please call and advise patient.

## 2013-08-23 ENCOUNTER — Encounter: Payer: Self-pay | Admitting: *Deleted

## 2013-08-24 ENCOUNTER — Ambulatory Visit (INDEPENDENT_AMBULATORY_CARE_PROVIDER_SITE_OTHER): Payer: Medicaid Other | Admitting: Adult Health

## 2013-08-24 ENCOUNTER — Encounter: Payer: Self-pay | Admitting: Adult Health

## 2013-08-24 VITALS — BP 105/74 | HR 72 | Wt 255.0 lb

## 2013-08-24 DIAGNOSIS — M549 Dorsalgia, unspecified: Secondary | ICD-10-CM

## 2013-08-24 DIAGNOSIS — E669 Obesity, unspecified: Secondary | ICD-10-CM

## 2013-08-24 DIAGNOSIS — M546 Pain in thoracic spine: Secondary | ICD-10-CM

## 2013-08-24 DIAGNOSIS — G8929 Other chronic pain: Secondary | ICD-10-CM

## 2013-08-24 HISTORY — DX: Pain in thoracic spine: M54.6

## 2013-08-24 NOTE — Patient Instructions (Signed)
Calorie Counting Diet A calorie counting diet requires you to eat the number of calories that are right for you in a day. Calories are the measurement of how much energy you get from the food you eat. Eating the right amount of calories is important for staying at a healthy weight. If you eat too many calories, your body will store them as fat and you may gain weight. If you eat too few calories, you may lose weight. Counting the number of calories you eat during a day will help you know if you are eating the right amount. A Registered Dietitian can determine how many calories you need in a day. The amount of calories needed varies from person to person. If your goal is to lose weight, you will need to eat fewer calories. Losing weight can benefit you if you are overweight or have health problems such as heart disease, high blood pressure, or diabetes. If your goal is to gain weight, you will need to eat more calories. Gaining weight may be necessary if you have a certain health problem that causes your body to need more energy. TIPS Whether you are increasing or decreasing the number of calories you eat during a day, it may be hard to get used to changes in what you eat and drink. The following are tips to help you keep track of the number of calories you eat.  Measure foods at home with measuring cups. This helps you know the amount of food and number of calories you are eating.  Restaurants often serve food in amounts that are larger than 1 serving. While eating out, estimate how many servings of a food you are given. For example, a serving of cooked rice is  cup or about the size of half of a fist. Knowing serving sizes will help you be aware of how much food you are eating at restaurants.  Ask for smaller portion sizes or child-size portions at restaurants.  Plan to eat half of a meal at a restaurant. Take the rest home or share the other half with a friend.  Read the Nutrition Facts panel on  food labels for calorie content and serving size. You can find out how many servings are in a package, the size of a serving, and the number of calories each serving has.  For example, a package might contain 3 cookies. The Nutrition Facts panel on that package says that 1 serving is 1 cookie. Below that, it will say there are 3 servings in the container. The calories section of the Nutrition Facts label says there are 90 calories. This means there are 90 calories in 1 cookie (1 serving). If you eat 1 cookie you have eaten 90 calories. If you eat all 3 cookies, you have eaten 270 calories (3 servings x 90 calories = 270 calories). The list below tells you how big or small some common portion sizes are.  1 oz.........4 stacked dice.  3 oz.........Deck of cards.  1 tsp........Tip of little finger.  1 tbs........Thumb.  2 tbs........Golf ball.   cup.......Half of a fist.  1 cup........A fist. KEEP A FOOD LOG Write down every food item you eat, the amount you eat, and the number of calories in each food you eat during the day. At the end of the day, you can add up the total number of calories you have eaten. It may help to keep a list like the one below. Find out the calorie information by reading the   Nutrition Facts panel on food labels. Breakfast  Bran cereal (1 cup, 110 calories).  Fat-free milk ( cup, 45 calories). Snack  Apple (1 medium, 80 calories). Lunch  Spinach (1 cup, 20 calories).  Tomato ( medium, 20 calories).  Chicken breast strips (3 oz, 165 calories).  Shredded cheddar cheese ( cup, 110 calories).  Light [place] dressing (2 tbs, 60 calories).  Whole-wheat bread (1 slice, 80 calories).  Tub margarine (1 tsp, 35 calories).  Vegetable soup (1 cup, 160 calories). Dinner  Pork chop (3 oz, 190 calories).  Brown rice (1 cup, 215 calories).  Steamed broccoli ( cup, 20 calories).  Strawberries (1  cup, 65 calories).  Whipped cream (1 tbs, 50  calories). Daily Calorie Total: 1425 Document Released: 03/15/2005 Document Revised: 06/07/2011 Document Reviewed: 09/09/2006 [facility] Patient Information 2014 [place], [place]. Chronic Back Pain  When back pain lasts longer than 3 months, it is called chronic back pain.People with chronic back pain often go through certain periods that are more intense (flare-ups).  CAUSES Chronic back pain can be caused by wear and tear (degeneration) on different structures in your back. These structures include:  The bones of your spine (vertebrae) and the joints surrounding your spinal cord and nerve roots (facets).  The strong, fibrous tissues that connect your vertebrae (ligaments). Degeneration of these structures may result in pressure on your nerves. This can lead to constant pain. HOME CARE INSTRUCTIONS  Avoid bending, heavy lifting, prolonged sitting, and activities which make the problem worse.  Take brief periods of rest throughout the day to reduce your pain. Lying down or standing usually is better than sitting while you are resting.  Take over-the-counter or prescription medicines only as directed by your caregiver. SEEK IMMEDIATE MEDICAL CARE IF:   You have weakness or numbness in one of your legs or feet.  You have trouble controlling your bladder or bowels.  You have nausea, vomiting, abdominal pain, shortness of breath, or fainting. Document Released: 04/22/2004 Document Revised: 06/07/2011 Document Reviewed: 02/27/2011 [facility] Patient Information 2014 [place], [place].

## 2013-08-24 NOTE — Progress Notes (Signed)
I agree with the assessment and plan as directed by NP .The patient is known to me .   Gabryela Kimbrell, MD  

## 2013-08-24 NOTE — Progress Notes (Signed)
PATIENT: Vanessa Donaldson DOB: 08/04/1980  REASON FOR VISIT: follow up HISTORY FROM: patient  HISTORY OF PRESENT ILLNESS: Vanessa Donaldson is a 33 year old African American female with a history of chronic neck and back pain as well as headaches. The patient was last seen through this office in 2013 and at that time she was referred here by Urgent care. At that time MRI of the brain and cervical spine was ordered. MRI  of the brain was unremarkable. The MRI of the cervical spine showed mild disc degenerative changes at C3-4 with left sided foraminal narrowing with no definite compression. Patient returns today for back pain. At her last visit she was referred to a pain clinic and she is unsure if she ever went.  Patient is having pain in the thoracic area to the right of her spine. Patient states that the pain radiates under the right axilla and to the right breast. Patient has visited several physicians with this same pain. She had a CT of the chest ordered but insurance denied the request. In the past patient has had pain in her lower back. She visits her PCP who gives her injections for pain. No new medical issues since the last visit.  REVIEW OF SYSTEMS: Full 14 system review of systems performed and notable only for:  Constitutional: N/A  Eyes: N/A Ear/Nose/Throat: N/A  Skin: N/A  Cardiovascular: chest pain, leg swelling  Respiratory: shortness of breath Gastrointestinal: constipation   Genitourinary: N/A Hematology/Lymphatic: N/A  Endocrine: excessive thirst Musculoskeletal:joint pain, back pain, aching muscles, muscle cramps, walking difficulty, neck stiffness  Allergy/Immunology: N/A  Neurological: dizziness, headache, numbness, weakness Psychiatric: depression, nervous/anxious Sleep: restless leg, insomnia   ALLERGIES: Allergies  Allergen Reactions  . Hydrocodone Nausea Only and Other (See Comments)    Migraine headaches    HOME MEDICATIONS: Outpatient Prescriptions Prior to  Visit  Medication Sig Dispense Refill  . cyclobenzaprine (FLEXERIL) 5 MG tablet Take 1 tablet (5 mg total) by mouth 3 (three) times daily as needed for muscle spasms.  30 tablet  0  . gabapentin (NEURONTIN) 300 MG capsule Take 1 daily for 3 days, 1 BID for 3 days, then 1 TID  90 capsule  0  . ibuprofen (ADVIL,MOTRIN) 800 MG tablet Take 1,600-2,400 mg by mouth every 8 (eight) hours as needed (for pain).       . naproxen sodium (ANAPROX) 220 MG tablet Take 440 mg by mouth 2 (two) times daily as needed (for pain).       . predniSONE (DELTASONE) 20 MG tablet Take 3 daily for 5 days, 2 daily for 5 days, 1 daily for 5 days.  30 tablet  0  . propranolol (INDERAL) 60 MG tablet Take 60 mg by mouth 3 (three) times daily.      . TRAMADOL HCL PO Take by mouth.      . TRAZODONE HCL PO Take by mouth.       No facility-administered medications prior to visit.    PAST MEDICAL HISTORY: Past Medical History  Diagnosis Date  . Chronic back pain   . Drug abuse     marijuana  . Chlamydia   . Smoker   . Obesity   . Complication of anesthesia     perceived problem with epidural  . Chronic neck pain   . Gall stones   . Hypercholesterolemia     PAST SURGICAL HISTORY: Past Surgical History  Procedure Laterality Date  . Btl    .  Tubal ligation      had a baby since BTL  . Cholecystectomy N/A 11/20/2012    Procedure: LAPAROSCOPIC CHOLECYSTECTOMY WITH INTRAOPERATIVE CHOLANGIOGRAM;  Surgeon: Valarie Merino, MD;  Location: [facility];  Service: General;  Laterality: N/A;  . Rectal exam under anesthesia  11/20/2012    Procedure: RECTAL EXAM UNDER ANESTHESIA;  Surgeon: Valarie Merino, MD;  Location: [facility];  Service: General;;    FAMILY HISTORY: Family History  Problem Relation Age of Onset  . Edema Mother   . Arthritis Mother   . Pancreatic cancer Other     [employer]  . Emphysema Mother   . Cancer Brother     Stage 1    SOCIAL HISTORY: History   Social History  . Marital Status:  Single    Spouse Name: N/A    Number of Children: N/A  . Years of Education: N/A   Occupational History  . Not on file.   Social History Main Topics  . Smoking status: Current Every Day Smoker -- 0.25 packs/day for 8 years    Types: Cigarettes  . Smokeless tobacco: Never Used  . Alcohol Use: Yes     Comment: occasional  . Drug Use: No  . Sexual Activity: Yes    Birth Control/ Protection: Surgical   Other Topics Concern  . Not on file   Social History Narrative   Single with 5 children   12 th grade education             PHYSICAL EXAM  Filed Vitals:   08/24/13 0931  BP: 105/74  Pulse: 72  Weight: 255 lb (115.667 kg)   Body mass index is 46.63 kg/(m^2).  Generalized: Well developed, in no acute distress   Neurological examination  Mentation: Alert oriented to time, place, history taking. Follows all commands speech and language fluent Cranial nerve II-XII:  Extraocular movements were full, visual field were full on confrontational test. Facial sensation and strength were normal. Motor: The motor testing reveals 5 over 5 strength of all 4 extremities. Good symmetric motor tone is noted throughout.  Sensory: Sensory testing is intact to soft touch, pinprick, vibration on all 4 extremities. No evidence of extinction is noted.  Coordination: Cerebellar testing reveals good finger-nose-finger and heel-to-shin bilaterally.  Gait and station: Gait is normal. Tandem gait is normal. Romberg is negative. No pain with flexion and extension of back and neck. No drift is seen.  Reflexes: Deep tendon reflexes are symmetric and normal bilaterally.   DIAGNOSTIC DATA (LABS, IMAGING, TESTING) - I reviewed patient records, labs, notes, testing and imaging myself where available.  Lab Results  Component Value Date   WBC 8.6 03/25/2013   HGB 13.9 03/25/2013   HCT 39.9 03/25/2013   MCV 87.3 03/25/2013   PLT 250 03/25/2013      Component Value Date/Time   NA 139 03/25/2013 1725    K 3.8 03/25/2013 1725   CL 102 03/25/2013 1725   CO2 28 03/25/2013 1725   GLUCOSE 100* 03/25/2013 1725   BUN 13 03/25/2013 1725   CREATININE 0.86 03/25/2013 1725   CALCIUM 9.3 03/25/2013 1725   PROT 7.0 03/25/2013 1725   ALBUMIN 3.6 03/25/2013 1725   AST 11 03/25/2013 1725   ALT 8 03/25/2013 1725   ALKPHOS 51 03/25/2013 1725   BILITOT 0.6 03/25/2013 1725   GFRNONAA 88* 03/25/2013 1725   GFRAA >90 03/25/2013 1725      ASSESSMENT AND PLAN 33 y.o. year old female  has a past medical history of Chronic back pain; Drug abuse; Chlamydia; Smoker; Obesity; Complication of anesthesia; Chronic neck pain; Gall stones; and Hypercholesterolemia. here with:  1. Thoracic spine pain 2. Obesity 3. Chronic back pain  Patient continues to have back pain. She now is having pain in the thoracic area. States that the pain usually starts to the right of the spine and radiates to the axilla and right breast. Patient has visited several physicians regarding this same pain. She has a CT for the chest ordered but insurance denied it. Patient feels like she has "nerve damage or a pinched nerve." I will order a MRI of the thoracic spine, however I am unsure if insurance will approve it. Patient's exam was unremarkable and I suspect the MRI of the thoracic spine will be as well. Therefore I have referred her to a pain clinic to help manage her chronic back pain. Also, I advised the patient about diet and exercise, as the patient's weight could be contributing to chronic back pain. Patient verbalized understanding. Patient should follow-up in 6 months or sooner if needed.    Butch Penny, MSN, NP-C 08/24/2013, 9:38 AM [facility] [address], [address] [place], [place] [address] [phone]  Note: This document was prepared with digital dictation and possible smart phrase technology. Any transcriptional errors that result from this process are unintentional.

## 2013-08-29 ENCOUNTER — Telehealth: Payer: Self-pay | Admitting: Adult Health

## 2013-08-29 NOTE — Telephone Encounter (Signed)
Pt's information was given to [place], CMA. Please advise

## 2013-08-29 NOTE — Telephone Encounter (Signed)
Vanessa Donaldson with Dr Eduard Clos received referral from [facility] and wanted to make her aware pt insurance is inactive.  She's scheduled for tomorrow to see Dr Eduard Clos, will check insurance again later today.  If still shows inactive appoinment will be cancelled.  [phone]

## 2013-10-09 ENCOUNTER — Other Ambulatory Visit: Payer: Medicaid Other

## 2013-10-09 ENCOUNTER — Telehealth: Payer: Self-pay | Admitting: Adult Health

## 2013-10-09 NOTE — Telephone Encounter (Signed)
[facility] [phone]  - Medicaid was denied - patient has 7:30 pm appointment - please call Victorino DikeJennifer back if this needs to be cancelled.

## 2013-10-09 NOTE — Telephone Encounter (Signed)
Victorino DikeJennifer [facility] and wanted to know if patient wanted to pay out of pocket for precedure, is scheduled today at 7:00.  Lt message for patient to return call  back before 5:00 today if appt. Is to be cancelled

## 2013-12-12 ENCOUNTER — Ambulatory Visit: Payer: Medicaid Other | Admitting: Neurology

## 2014-01-28 ENCOUNTER — Encounter: Payer: Self-pay | Admitting: Adult Health

## 2014-03-04 ENCOUNTER — Encounter: Payer: Self-pay | Admitting: Adult Health

## 2014-03-04 ENCOUNTER — Ambulatory Visit (INDEPENDENT_AMBULATORY_CARE_PROVIDER_SITE_OTHER): Payer: Medicaid Other | Admitting: Adult Health

## 2014-03-04 VITALS — BP 107/69 | HR 66 | Temp 98.7°F | Ht 62.0 in | Wt 256.0 lb

## 2014-03-04 DIAGNOSIS — M546 Pain in thoracic spine: Secondary | ICD-10-CM

## 2014-03-04 DIAGNOSIS — G8929 Other chronic pain: Secondary | ICD-10-CM

## 2014-03-04 DIAGNOSIS — M549 Dorsalgia, unspecified: Secondary | ICD-10-CM

## 2014-03-04 NOTE — Progress Notes (Signed)
PATIENT: Vanessa Donaldson DOB: 05/23/1980  REASON FOR VISIT: follow up HISTORY FROM: patient  HISTORY OF PRESENT ILLNESS:  Ms. Vanessa Donaldson is a 33 year old female with a history of chronic back pain. She returns today for follow-up. A thoracic MRI was ordered at the last visit the patient was unable to get this done due to her insurance. The patient was also referred to a pain clinic. She states that Dr. Eduard ClosBethea wanted a MRI of the spine but insurance denied that as well. No follow-up appointment was established. The patient continues to have pain in the thoracic region on the right side radiating from the middle of the spine to the right axilla. She will sometimes have pain in the middle of the back. She states that this pain started after she had her child and gain weight. The patient is currently not dieting or exercising. 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 Patient is able to complete all ADLs without assistance at home. Her PCP is Lerry Linerwight Williams and she was getting steroid injections from him.   HISTORY 08/24/13: 33 year old African American female with a history of chronic neck and back pain as well as headaches. The patient was last seen through this office in 2013 and at that time she was referred here by Urgent care. At that time MRI of the brain and cervical spine was ordered. MRI of the brain was unremarkable. The MRI of the cervical spine showed mild disc degenerative changes at C3-4 with left sided foraminal narrowing with no definite compression. Patient returns today for back pain. At her last visit she was referred to a pain clinic and she is unsure if she ever went. Patient is having pain in the thoracic area to the right of her spine. Patient states that the pain radiates under the right axilla and to the right breast. Patient has visited several physicians with this same pain. She had a CT of the chest ordered but insurance denied the request. In the past patient has had pain in her lower  back. She visits her PCP who gives her injections for pain. No new medical issues since the last visit  REVIEW OF SYSTEMS: Out of a complete 14 system review of symptoms, the patient complains only of the following symptoms, and all other reviewed systems are negative.  Abdominal pain Chest pain Restless leg Joint pain, back pain, muscle cramps, neck pain, neck stiffness Headache  ALLERGIES: Allergies  Allergen Reactions  . Hydrocodone Nausea Only and Other (See Comments)    Migraine headaches    HOME MEDICATIONS: Outpatient Prescriptions Prior to Visit  Medication Sig Dispense Refill  . cyclobenzaprine (FLEXERIL) 5 MG tablet Take 1 tablet (5 mg total) by mouth 3 (three) times daily as needed for muscle spasms. 30 tablet 0  . gabapentin (NEURONTIN) 300 MG capsule Take 1 daily for 3 days, 1 BID for 3 days, then 1 TID 90 capsule 0  . ibuprofen (ADVIL,MOTRIN) 800 MG tablet Take 1,600-2,400 mg by mouth every 8 (eight) hours as needed (for pain).     . naproxen sodium (ANAPROX) 220 MG tablet Take 440 mg by mouth 2 (two) times daily as needed (for pain).     . predniSONE (DELTASONE) 20 MG tablet Take 3 daily for 5 days, 2 daily for 5 days, 1 daily for 5 days. 30 tablet 0  . propranolol (INDERAL) 60 MG tablet Take 60 mg by mouth 3 (three) times daily.    . TRAMADOL HCL  PO Take by mouth.     No facility-administered medications prior to visit.    PAST MEDICAL HISTORY: Past Medical History  Diagnosis Date  . Chronic back pain   . Drug abuse     marijuana  . Chlamydia   . Smoker   . Obesity   . Complication of anesthesia     perceived problem with epidural  . Chronic neck pain   . Gall stones   . Hypercholesterolemia     PAST SURGICAL HISTORY: Past Surgical History  Procedure Laterality Date  . Btl    . Tubal ligation      had a baby since BTL  . Cholecystectomy N/A 11/20/2012    Procedure: LAPAROSCOPIC CHOLECYSTECTOMY WITH INTRAOPERATIVE CHOLANGIOGRAM;  Surgeon: Valarie MerinoMatthew B  Martin, MD;  Location: [facility];  Service: General;  Laterality: N/A;  . Rectal exam under anesthesia  11/20/2012    Procedure: RECTAL EXAM UNDER ANESTHESIA;  Surgeon: Valarie MerinoMatthew B Martin, MD;  Location: [facility];  Service: General;;    FAMILY HISTORY: Family History  Problem Relation Age of Onset  . Edema Mother   . Arthritis Mother   . Pancreatic cancer Other     Mat. [employer] Grandmother  . Emphysema Mother   . Cancer Brother     Stage 1    SOCIAL HISTORY: History   Social History  . Marital Status: Single    Spouse Name: N/A    Number of Children: N/A  . Years of Education: N/A   Occupational History  . Not on file.   Social History Main Topics  . Smoking status: Current Every Day Smoker -- 0.25 packs/day for 8 years    Types: Cigarettes  . Smokeless tobacco: Never Used  . Alcohol Use: Yes     Comment: occasional  . Drug Use: No  . Sexual Activity: Yes    Birth Control/ Protection: Surgical   Other Topics Concern  . Not on file   Social History Narrative   Single with 5 children   12 th grade education             PHYSICAL EXAM  Filed Vitals:   03/04/14 1431  BP: 107/69  Pulse: 66  Temp: 98.7 F (37.1 C)  TempSrc: Oral  Height: 5\' 2"  (1.575 m)  Weight: 256 lb (116.121 kg)   Body mass index is 46.81 kg/(m^2).  Generalized: Well developed, in no acute distress   Neurological examination  Mentation: Alert oriented to time, place, history taking. Follows all commands speech and language fluent Cranial nerve II-XII: Pupils were equal round reactive to light. Extraocular movements were full, visual field were full on confrontational test. Facial sensation and strength were normal. Uvula tongue midline. Head turning and shoulder shrug  were normal and symmetric. Motor: The motor testing reveals 5 over 5 strength of all 4 extremities. Good symmetric motor tone is noted throughout. Denies pain with flexion and extension of spine. Mouth pain noted with rotation of  the spine to the right. Sensory: Sensory testing is intact to soft touch on all 4 extremities. No evidence of extinction is noted.  Coordination: Cerebellar testing reveals good finger-nose-finger and heel-to-shin bilaterally.  Gait and station: Gait is normal. Tandem gait is normal. Romberg is negative. No drift is seen.  Reflexes: Deep tendon reflexes are symmetric and normal bilaterally.    DIAGNOSTIC DATA (LABS, IMAGING, TESTING) - I reviewed patient records, labs, notes, testing and imaging myself where available.  Lab Results  Component Value Date  WBC 8.6 03/25/2013   HGB 13.9 03/25/2013   HCT 39.9 03/25/2013   MCV 87.3 03/25/2013   PLT 250 03/25/2013      Component Value Date/Time   NA 139 03/25/2013 1725   K 3.8 03/25/2013 1725   CL 102 03/25/2013 1725   CO2 28 03/25/2013 1725   GLUCOSE 100* 03/25/2013 1725   BUN 13 03/25/2013 1725   CREATININE 0.86 03/25/2013 1725   CALCIUM 9.3 03/25/2013 1725   PROT 7.0 03/25/2013 1725   ALBUMIN 3.6 03/25/2013 1725   AST 11 03/25/2013 1725   ALT 8 03/25/2013 1725   ALKPHOS 51 03/25/2013 1725   BILITOT 0.6 03/25/2013 1725   GFRNONAA 88* 03/25/2013 1725   GFRAA >90 03/25/2013 1725      ASSESSMENT AND PLAN 33 y.o. year old female  has a past medical history of Chronic back pain; Drug abuse; Chlamydia; Smoker; Obesity; Complication of anesthesia; Chronic neck pain; Gall stones; and Hypercholesterolemia. here with:  1. Chronic back pain 2. Thoracic back pain radiating to the right side  The patient continues to have ongoing chronic back pain and thoracic pain that radiates to the right side. Her insurance has denied request for MRI of the thoracic spine. She states that she did visit with Dr. Eduard Clos and she ordered a MRI of the spine as well. Since that was denied the patient has not followed up with Dr. Eduard Clos. The patient's physical exam is relatively unremarkable. She does have some mild pain when she rotates the spine to the  right. I have explained to the patient that we typically do not get out of pain medicine at [facility]. I have encouraged the patient to begin dieting and exercising to lose weight. I have encouraged the patient to call Dr. Eduard Clos to schedule a follow-up visit. If patient is unable to get an appointment she should let us know. If the patient becomes established with Dr.Bethea she can follow up with [place] as needed.   Butch Penny, MSN, NP-C 03/04/2014, 2:37 PM [facility] [address] [place], [place] [address] [phone]  Note: This document was prepared with digital dictation and possible smart phrase technology. Any transcriptional errors that result from this process are unintentional.

## 2014-03-04 NOTE — Patient Instructions (Signed)

## 2014-03-05 NOTE — Progress Notes (Signed)
I agree with the assessment and plan as directed by NP .The patient is known to me .   Barton Want, MD  

## 2014-03-30 ENCOUNTER — Emergency Department (HOSPITAL_COMMUNITY): Payer: Medicaid Other

## 2014-03-30 ENCOUNTER — Encounter (HOSPITAL_COMMUNITY): Payer: Self-pay | Admitting: Emergency Medicine

## 2014-03-30 ENCOUNTER — Emergency Department (HOSPITAL_COMMUNITY)
Admission: EM | Admit: 2014-03-30 | Discharge: 2014-03-30 | Disposition: A | Discharge status: Home / Self Care | Payer: Medicaid Other | Attending: Emergency Medicine | Admitting: Emergency Medicine

## 2014-03-30 DIAGNOSIS — R69 Illness, unspecified: Secondary | ICD-10-CM

## 2014-03-30 DIAGNOSIS — Z7952 Long term (current) use of systemic steroids: Secondary | ICD-10-CM | POA: Diagnosis not present

## 2014-03-30 DIAGNOSIS — Z720 Tobacco use: Secondary | ICD-10-CM | POA: Diagnosis not present

## 2014-03-30 DIAGNOSIS — Z8619 Personal history of other infectious and parasitic diseases: Secondary | ICD-10-CM | POA: Insufficient documentation

## 2014-03-30 DIAGNOSIS — Z79899 Other long term (current) drug therapy: Secondary | ICD-10-CM | POA: Insufficient documentation

## 2014-03-30 DIAGNOSIS — R0789 Other chest pain: Secondary | ICD-10-CM | POA: Diagnosis not present

## 2014-03-30 DIAGNOSIS — Z8709 Personal history of other diseases of the respiratory system: Secondary | ICD-10-CM | POA: Insufficient documentation

## 2014-03-30 DIAGNOSIS — J029 Acute pharyngitis, unspecified: Secondary | ICD-10-CM | POA: Diagnosis present

## 2014-03-30 DIAGNOSIS — R05 Cough: Secondary | ICD-10-CM

## 2014-03-30 DIAGNOSIS — E669 Obesity, unspecified: Secondary | ICD-10-CM | POA: Insufficient documentation

## 2014-03-30 DIAGNOSIS — R059 Cough, unspecified: Secondary | ICD-10-CM

## 2014-03-30 DIAGNOSIS — J111 Influenza due to unidentified influenza virus with other respiratory manifestations: Secondary | ICD-10-CM | POA: Diagnosis not present

## 2014-03-30 DIAGNOSIS — G8929 Other chronic pain: Secondary | ICD-10-CM | POA: Diagnosis not present

## 2014-03-30 LAB — RAPID STREP SCREEN (MED CTR MEBANE ONLY): Streptococcus, Group A Screen (Direct): NEGATIVE

## 2014-03-30 MED ORDER — KETOROLAC TROMETHAMINE 60 MG/2ML IM SOLN
60.0000 mg | Freq: Once | INTRAMUSCULAR | Status: AC
  Administered 2014-03-30: 60 mg via INTRAMUSCULAR
  Filled 2014-03-30: qty 2

## 2014-03-30 NOTE — ED Provider Notes (Signed)
CSN: [id number]     Arrival date & time 03/30/14  0810 History   First MD Initiated Contact with Patient 03/30/14 0848     Chief Complaint  Patient presents with  . Generalized Body Aches  . Sore Throat     (Consider location/radiation/quality/duration/timing/severity/associated sxs/prior Treatment) Patient is a 34 y.o. female presenting with pharyngitis and chest pain. The history is provided by the patient. No language interpreter was used.  Sore Throat This is a new problem. The current episode started yesterday. The problem occurs constantly. The problem has not changed since onset.Associated symptoms include chest pain and headaches. Pertinent negatives include no abdominal pain and no shortness of breath. Nothing aggravates the symptoms. Nothing relieves the symptoms. She has tried nothing for the symptoms. The treatment provided no relief.  Chest Pain Pain location:  R lateral chest Pain quality: sharp   Pain radiates to:  Does not radiate Pain radiates to the back: no   Pain severity:  Moderate Duration: "years" Timing:  Constant Progression:  Waxing and waning Chronicity:  Chronic Context: at rest   Relieved by:  Nothing Worsened by:  Coughing Associated symptoms: cough and headache   Associated symptoms: no abdominal pain, no back pain, no diaphoresis, no fatigue, no fever, no lower extremity edema, no nausea, no numbness, no palpitations, no shortness of breath, not vomiting and no weakness   Risk factors: obesity and smoking   Risk factors: no diabetes mellitus, no high cholesterol, no hypertension and no prior DVT/PE     Past Medical History  Diagnosis Date  . Chronic back pain   . Drug abuse     marijuana  . Chlamydia   . Smoker   . Obesity   . Complication of anesthesia     perceived problem with epidural  . Chronic neck pain   . Gall stones   . Hypercholesterolemia    Past Surgical History  Procedure Laterality Date  . Btl    . Tubal ligation      had  a baby since BTL  . Cholecystectomy N/A 11/20/2012    Procedure: LAPAROSCOPIC CHOLECYSTECTOMY WITH INTRAOPERATIVE CHOLANGIOGRAM;  Surgeon: Valarie Merino, MD;  Location: [facility];  Service: General;  Laterality: N/A;  . Rectal exam under anesthesia  11/20/2012    Procedure: RECTAL EXAM UNDER ANESTHESIA;  Surgeon: Valarie Merino, MD;  Location: [facility];  Service: General;;   Family History  Problem Relation Age of Onset  . Edema Mother   . Arthritis Mother   . Pancreatic cancer Other     Mat. [employer]  . Emphysema Mother   . Cancer Brother     Stage 1   History  Substance Use Topics  . Smoking status: Current Every Day Smoker -- 0.25 packs/day for 8 years    Types: Cigarettes  . Smokeless tobacco: Never Used  . Alcohol Use: Yes     Comment: occasional   OB History    Gravida Para Term Preterm AB TAB SAB Ectopic Multiple Living   Review of Systems  Constitutional: Negative for fever, chills, diaphoresis, activity change, appetite change and fatigue.  HENT: Negative for congestion, facial swelling, rhinorrhea and sore throat.   Eyes: Negative for photophobia and discharge.  Respiratory: Positive for cough. Negative for chest tightness and shortness of breath.   Cardiovascular: Positive for chest pain. Negative for palpitations and leg swelling.  Gastrointestinal: Negative for nausea,  vomiting, abdominal pain and diarrhea.  Endocrine: Negative for polydipsia and polyuria.  Genitourinary: Negative for dysuria, frequency, difficulty urinating and pelvic pain.  Musculoskeletal: Negative for back pain, arthralgias, neck pain and neck stiffness.  Skin: Negative for color change and wound.  Allergic/Immunologic: Negative for immunocompromised state.  Neurological: Positive for headaches. Negative for facial asymmetry, weakness and numbness.  Hematological: Does not bruise/bleed easily.  Psychiatric/Behavioral: Negative for confusion and agitation.       Allergies  Hydrocodone  Home Medications   Prior to Admission medications   Medication Sig Start Date End Date Taking? Authorizing Provider  ibuprofen (ADVIL,MOTRIN) 800 MG tablet Take 1,600-2,400 mg by mouth every 8 (eight) hours as needed (for pain).    Yes Historical Provider, MD  naproxen sodium (ANAPROX) 220 MG tablet Take 440 mg by mouth 2 (two) times daily as needed (for pain).    Yes Historical Provider, MD  cyclobenzaprine (FLEXERIL) 5 MG tablet Take 1 tablet (5 mg total) by mouth 3 (three) times daily as needed for muscle spasms. Patient not taking: Reported on 03/04/2014 06/22/13   Reuben Likes, MD  gabapentin (NEURONTIN) 300 MG capsule Take 1 daily for 3 days, 1 BID for 3 days, then 1 TID Patient not taking: Reported on 03/04/2014 06/22/13   Reuben Likes, MD  predniSONE (DELTASONE) 20 MG tablet Take 3 daily for 5 days, 2 daily for 5 days, 1 daily for 5 days. Patient not taking: Reported on 03/04/2014 06/22/13   Reuben Likes, MD  propranolol (INDERAL) 60 MG tablet Take 60 mg by mouth 3 (three) times daily.    Historical Provider, MD  TRAMADOL HCL PO Take by mouth.    Historical Provider, MD   BP 125/66 mmHg  Pulse 118  Temp(Src) 98 F (36.7 C) (Oral)  Resp 18  SpO2 99%  LMP 03/24/2014 Physical Exam  Constitutional: She is oriented to person, place, and time. She appears well-developed and well-nourished. No distress.  HENT:  Head: Normocephalic and atraumatic.  Mouth/Throat: Posterior oropharyngeal erythema present. No oropharyngeal exudate, posterior oropharyngeal edema or tonsillar abscesses.  Eyes: Pupils are equal, round, and reactive to light.  Neck: Normal range of motion. Neck supple.  Cardiovascular: Normal rate, regular rhythm and normal heart sounds.  Exam reveals no gallop and no friction rub.   No murmur heard. Pulmonary/Chest: Effort normal and breath sounds normal. No respiratory distress. She has no wheezes. She has no rales.  Abdominal: Soft.  Bowel sounds are normal. She exhibits no distension and no mass. There is no tenderness. There is no rebound and no guarding.  Musculoskeletal: Normal range of motion. She exhibits no edema or tenderness.  Neurological: She is alert and oriented to person, place, and time.  Skin: Skin is warm and dry.  Psychiatric: She has a normal mood and affect.    ED Course  Procedures (including critical care time) Labs Review Labs Reviewed  RAPID STREP SCREEN    Imaging Review No results found.   EKG Interpretation None      MDM   Final diagnoses:  Cough  Right-sided chest wall pain    Pt is a 34 y.o. female with Pmhx as above who presents with cough, congestion, rhinorrhea, myalgias, chills and sore throat for less than 24 hours.  She also reports a right lateral sharp chest pain that she has had for "years", is painful daily.  She reports she has told her primary doctor and has also had a mammogram for it, but  no one has evaluated this pain. On PE, pt initially tachycardic, but in NAD, texting on cell phone. Cardiopulm exam otherwise benign, mild posterior oropharyngeal erythema, lungs clear. She has tender mass palpated in SQ tissues of R lateral chest wall, likely a lymph node. No axillary or supraclavicular nodes. Rapid strep negative CXR nml. As this pain has been present for years, CXR nml, I do not feel she requires further w/o today.  Will have her f/u with [facility] for further eval. Suspect influenza-like illness, will rec antipyretics, inc fluids.     Kilee L Angeletti evaluation in the Emergency Department is complete. It has been determined that no acute conditions requiring further emergency intervention are present at this time. The patient/guardian have been advised of the diagnosis and plan. We have discussed signs and symptoms that warrant return to the ED, such as changes or worsening in symptoms, SOB, fever.       Toy Cookey, MD 03/30/14 1121

## 2014-03-30 NOTE — ED Notes (Signed)
Pt sts that she wants an [place] of her back where she is having pain. Pt thinks she may "have a tumor or something because it hurts." Dr Micheline Maze notified

## 2014-03-30 NOTE — ED Notes (Signed)
Pt sts that she has URI s/sx x1 day. Pt is A&O and in NAD. Pt provided with pillow and warm blanket per request. Pt VSS

## 2014-03-30 NOTE — Discharge Instructions (Signed)

## 2014-03-30 NOTE — ED Notes (Signed)
Pt from home c/o sore throat, running nose, cough and generalized body aches since Friday. She reports pneumonia is going around at work.

## 2014-03-30 NOTE — ED Notes (Signed)
Dr. Docherty at bedside.

## 2014-03-30 NOTE — ED Notes (Signed)
MD Dochterty came in to talk to pt and tell her to f/u with PCP for [place] if still hurting.  She explained to pt that we treat emergencies and chest x ray was ok.  Pt  Agreed to f/u with PCP on Monday

## 2014-04-01 LAB — CULTURE, GROUP A STREP

## 2015-05-19 ENCOUNTER — Encounter (HOSPITAL_COMMUNITY): Payer: Self-pay | Admitting: Emergency Medicine

## 2015-05-19 ENCOUNTER — Emergency Department (HOSPITAL_COMMUNITY): Payer: Medicaid Other

## 2015-05-19 DIAGNOSIS — F17210 Nicotine dependence, cigarettes, uncomplicated: Secondary | ICD-10-CM | POA: Diagnosis not present

## 2015-05-19 DIAGNOSIS — G8929 Other chronic pain: Secondary | ICD-10-CM | POA: Insufficient documentation

## 2015-05-19 DIAGNOSIS — R079 Chest pain, unspecified: Secondary | ICD-10-CM | POA: Insufficient documentation

## 2015-05-19 DIAGNOSIS — E669 Obesity, unspecified: Secondary | ICD-10-CM | POA: Diagnosis not present

## 2015-05-19 LAB — CBC
HCT: 40 % (ref 36.0–46.0)
HEMOGLOBIN: 13.2 g/dL (ref 12.0–15.0)
MCH: 28.6 pg (ref 26.0–34.0)
MCHC: 33 g/dL (ref 30.0–36.0)
MCV: 86.6 fL (ref 78.0–100.0)
Platelets: 262 10*3/uL (ref 150–400)
RBC: 4.62 MIL/uL (ref 3.87–5.11)
RDW: 13.4 % (ref 11.5–15.5)
WBC: 5.6 10*3/uL (ref 4.0–10.5)

## 2015-05-19 LAB — BASIC METABOLIC PANEL
ANION GAP: 12 (ref 5–15)
BUN: 8 mg/dL (ref 6–20)
CO2: 24 mmol/L (ref 22–32)
Calcium: 9.6 mg/dL (ref 8.9–10.3)
Chloride: 105 mmol/L (ref 101–111)
Creatinine, Ser: 0.73 mg/dL (ref 0.44–1.00)
GFR calc Af Amer: >60 mL/min (ref >60)
GLUCOSE: 109 mg/dL — AB (ref 65–99)
POTASSIUM: 3.9 mmol/L (ref 3.5–5.1)
Sodium: 141 mmol/L (ref 135–145)

## 2015-05-19 LAB — I-STAT TROPONIN, ED: TROPONIN I, POC: 0 ng/mL (ref 0.00–0.08)

## 2015-05-19 NOTE — ED Notes (Signed)
Pt from home for eval of substernal sharp chest pain with radiation to back that started tonight while pt was getting ready for work. Pt states some diaphoresis and sob, denies any n/v/d at this time.

## 2015-05-20 ENCOUNTER — Other Ambulatory Visit: Payer: Self-pay | Admitting: Internal Medicine

## 2015-05-20 ENCOUNTER — Emergency Department (HOSPITAL_COMMUNITY)
Admission: EM | Admit: 2015-05-20 | Discharge: 2015-05-20 | Disposition: A | Discharge status: Home / Self Care | Payer: Medicaid Other | Attending: Emergency Medicine | Admitting: Emergency Medicine

## 2015-05-20 DIAGNOSIS — N63 Unspecified lump in unspecified breast: Secondary | ICD-10-CM

## 2015-05-20 NOTE — ED Notes (Signed)
Pt called for vital sign re-assessment, no answer x2.

## 2015-05-20 NOTE — ED Notes (Signed)
VS reassessment - no answer 

## 2015-05-26 ENCOUNTER — Other Ambulatory Visit: Payer: Medicaid Other

## 2015-05-26 ENCOUNTER — Inpatient Hospital Stay: Admission: RE | Admit: 2015-05-26 | Payer: Medicaid Other | Source: Ambulatory Visit

## 2015-08-06 ENCOUNTER — Encounter (HOSPITAL_COMMUNITY): Payer: Self-pay | Admitting: *Deleted

## 2015-08-06 ENCOUNTER — Ambulatory Visit (HOSPITAL_COMMUNITY)
Admission: EM | Admit: 2015-08-06 | Discharge: 2015-08-06 | Disposition: A | Discharge status: Home / Self Care | Payer: Medicaid Other | Attending: Family Medicine | Admitting: Family Medicine

## 2015-08-06 DIAGNOSIS — J302 Other seasonal allergic rhinitis: Secondary | ICD-10-CM

## 2015-08-06 MED ORDER — IPRATROPIUM BROMIDE 0.06 % NA SOLN: 2.0000 | Freq: Four times a day (QID) | NASAL | Status: DC

## 2015-08-06 MED ORDER — PREDNISONE 50 MG PO TABS: ORAL_TABLET | ORAL | Status: DC

## 2015-08-06 NOTE — ED Notes (Signed)
Pt   Reports        Symptoms  Of  Runny  Nose   Diarrhea    And  Congested       With    symptoms  X  sev   Days  Child  Has  Similar  Symptoms  As  Well

## 2015-08-06 NOTE — ED Provider Notes (Signed)
CSN: [id number]     Arrival date & time 08/06/15  1912 History   First MD Initiated Contact with Patient 08/06/15 2000     Chief Complaint  Patient presents with  . URI   (Consider location/radiation/quality/duration/timing/severity/associated sxs/prior Treatment) Patient is a 35 y.o. female presenting with URI. The history is provided by the patient.  URI Presenting symptoms: congestion, cough and rhinorrhea   Presenting symptoms: no fever   Severity:  Mild Onset quality:  Gradual Duration:  2 days Chronicity:  New Relieved by:  None tried Worsened by:  Nothing tried Ineffective treatments:  None tried Associated symptoms: sneezing   Associated symptoms: no wheezing     Past Medical History  Diagnosis Date  . Chronic back pain   . Drug abuse     marijuana  . Chlamydia   . Smoker   . Obesity   . Complication of anesthesia     perceived problem with epidural  . Chronic neck pain   . Gall stones   . Hypercholesterolemia    Past Surgical History  Procedure Laterality Date  . Btl    . Tubal ligation      had a baby since BTL  . Cholecystectomy N/A 11/20/2012    Procedure: LAPAROSCOPIC CHOLECYSTECTOMY WITH INTRAOPERATIVE CHOLANGIOGRAM;  Surgeon: Valarie Merino, MD;  Location: [facility];  Service: General;  Laterality: N/A;  . Rectal exam under anesthesia  11/20/2012    Procedure: RECTAL EXAM UNDER ANESTHESIA;  Surgeon: Valarie Merino, MD;  Location: [facility];  Service: General;;   Family History  Problem Relation Age of Onset  . Edema Mother   . Arthritis Mother   . Pancreatic cancer Other     Mat. [employer]  . Emphysema Mother   . Cancer Brother     Stage 1   Social History  Substance Use Topics  . Smoking status: Current Every Day Smoker -- 0.25 packs/day for 8 years    Types: Cigarettes  . Smokeless tobacco: Never Used  . Alcohol Use: Yes     Comment: occasional   OB History    Gravida Para Term Preterm AB TAB SAB Ectopic Multiple Living   Review of Systems  Constitutional: Negative.  Negative for fever.  HENT: Positive for congestion, postnasal drip, rhinorrhea and sneezing.   Respiratory: Positive for cough. Negative for shortness of breath and wheezing.   All other systems reviewed and are negative.   Allergies  Hydrocodone  Home Medications   Prior to Admission medications   Medication Sig Start Date End Date Taking? Authorizing Provider  cyclobenzaprine (FLEXERIL) 5 MG tablet Take 1 tablet (5 mg total) by mouth 3 (three) times daily as needed for muscle spasms. Patient not taking: Reported on 03/04/2014 06/22/13   Reuben Likes, MD  gabapentin (NEURONTIN) 300 MG capsule Take 1 daily for 3 days, 1 BID for 3 days, then 1 TID Patient not taking: Reported on 03/04/2014 06/22/13   Reuben Likes, MD  ibuprofen (ADVIL,MOTRIN) 800 MG tablet Take 1,600-2,400 mg by mouth every 8 (eight) hours as needed (for pain).     Historical Provider, MD  ipratropium (ATROVENT) 0.06 % nasal spray Place 2 sprays into both nostrils 4 (four) times daily. 08/06/15   Linna Hoff, MD  naproxen sodium (ANAPROX) 220 MG tablet Take 440 mg by mouth 2 (two) times daily as needed (for pain).  Historical Provider, MD  predniSONE (DELTASONE) 50 MG tablet 1 tab daily for 2 days then 1/2 tab daily for 2 days. 08/06/15   Linna HoffJames D Menno Vanbergen, MD  propranolol (INDERAL) 60 MG tablet Take 60 mg by mouth 3 (three) times daily.    Historical Provider, MD  TRAMADOL HCL PO Take by mouth.    Historical Provider, MD   Meds Ordered and Administered this Visit  Medications - No data to display  BP 103/71 mmHg  Pulse 80  Temp(Src) 98.8 F (37.1 C) (Oral)  Resp 16  SpO2 100%  LMP 07/22/2015 No data found.   Physical Exam  Constitutional: She is oriented to person, place, and time. She appears well-developed and well-nourished. No distress.  HENT:  Right Ear: External ear normal.  Left Ear: External ear normal.  Mouth/Throat: Oropharynx is clear and  moist.  Neck: Normal range of motion. Neck supple.  Cardiovascular: Normal heart sounds.   Pulmonary/Chest: Effort normal and breath sounds normal.  Neurological: She is alert and oriented to person, place, and time.  Skin: Skin is warm and dry.  Nursing note and vitals reviewed.   ED Course  Procedures (including critical care time)  Labs Review Labs Reviewed - No data to display  Imaging Review No results found.   Visual Acuity Review  Right Eye Distance:   Left Eye Distance:   Bilateral Distance:    Right Eye Near:   Left Eye Near:    Bilateral Near:         MDM   1. Seasonal allergic rhinitis        Linna HoffJames D Lennon Boutwell, MD 08/06/15 2007

## 2015-08-06 NOTE — Discharge Instructions (Signed)
Drink plenty of fluids as discussed, use medicine as prescribed, and mucinex or delsym for cough. Return or see your doctor if further problems °

## 2015-08-29 ENCOUNTER — Other Ambulatory Visit: Payer: Medicaid Other

## 2015-09-02 ENCOUNTER — Emergency Department (HOSPITAL_COMMUNITY): Payer: Medicaid Other

## 2015-09-02 ENCOUNTER — Encounter (HOSPITAL_COMMUNITY): Payer: Self-pay | Admitting: *Deleted

## 2015-09-02 ENCOUNTER — Emergency Department (HOSPITAL_COMMUNITY)
Admission: EM | Admit: 2015-09-02 | Discharge: 2015-09-02 | Disposition: A | Discharge status: Home / Self Care | Payer: Medicaid Other | Attending: Emergency Medicine | Admitting: Emergency Medicine

## 2015-09-02 DIAGNOSIS — E669 Obesity, unspecified: Secondary | ICD-10-CM | POA: Insufficient documentation

## 2015-09-02 DIAGNOSIS — Y92410 Unspecified street and highway as the place of occurrence of the external cause: Secondary | ICD-10-CM | POA: Diagnosis not present

## 2015-09-02 DIAGNOSIS — Z8719 Personal history of other diseases of the digestive system: Secondary | ICD-10-CM | POA: Insufficient documentation

## 2015-09-02 DIAGNOSIS — R519 Headache, unspecified: Secondary | ICD-10-CM

## 2015-09-02 DIAGNOSIS — Z8619 Personal history of other infectious and parasitic diseases: Secondary | ICD-10-CM | POA: Diagnosis not present

## 2015-09-02 DIAGNOSIS — Y9389 Activity, other specified: Secondary | ICD-10-CM | POA: Diagnosis not present

## 2015-09-02 DIAGNOSIS — Z3202 Encounter for pregnancy test, result negative: Secondary | ICD-10-CM | POA: Insufficient documentation

## 2015-09-02 DIAGNOSIS — G8929 Other chronic pain: Secondary | ICD-10-CM | POA: Diagnosis not present

## 2015-09-02 DIAGNOSIS — S199XXA Unspecified injury of neck, initial encounter: Secondary | ICD-10-CM | POA: Diagnosis present

## 2015-09-02 DIAGNOSIS — S0990XA Unspecified injury of head, initial encounter: Secondary | ICD-10-CM | POA: Diagnosis not present

## 2015-09-02 DIAGNOSIS — F17210 Nicotine dependence, cigarettes, uncomplicated: Secondary | ICD-10-CM | POA: Insufficient documentation

## 2015-09-02 DIAGNOSIS — S161XXA Strain of muscle, fascia and tendon at neck level, initial encounter: Secondary | ICD-10-CM | POA: Diagnosis not present

## 2015-09-02 DIAGNOSIS — R51 Headache: Secondary | ICD-10-CM

## 2015-09-02 DIAGNOSIS — Y998 Other external cause status: Secondary | ICD-10-CM | POA: Diagnosis not present

## 2015-09-02 LAB — CBC WITH DIFFERENTIAL/PLATELET
BASOS PCT: 0 %
Basophils Absolute: 0 10*3/uL (ref 0.0–0.1)
EOS ABS: 0.2 10*3/uL (ref 0.0–0.7)
Eosinophils Relative: 3 %
HCT: 42 % (ref 36.0–46.0)
HEMOGLOBIN: 13.9 g/dL (ref 12.0–15.0)
Lymphocytes Relative: 33 %
Lymphs Abs: 2.1 10*3/uL (ref 0.7–4.0)
MCH: 28.7 pg (ref 26.0–34.0)
MCHC: 33.1 g/dL (ref 30.0–36.0)
MCV: 86.8 fL (ref 78.0–100.0)
MONOS PCT: 6 %
Monocytes Absolute: 0.4 10*3/uL (ref 0.1–1.0)
NEUTROS PCT: 58 %
Neutro Abs: 3.7 10*3/uL (ref 1.7–7.7)
Platelets: 278 10*3/uL (ref 150–400)
RBC: 4.84 MIL/uL (ref 3.87–5.11)
RDW: 13.4 % (ref 11.5–15.5)
WBC: 6.4 10*3/uL (ref 4.0–10.5)

## 2015-09-02 LAB — BASIC METABOLIC PANEL
Anion gap: 5 (ref 5–15)
BUN: 8 mg/dL (ref 6–20)
CALCIUM: 9.3 mg/dL (ref 8.9–10.3)
CO2: 29 mmol/L (ref 22–32)
CREATININE: 0.75 mg/dL (ref 0.44–1.00)
Chloride: 105 mmol/L (ref 101–111)
GFR calc non Af Amer: >60 mL/min (ref >60)
Glucose, Bld: 108 mg/dL — ABNORMAL HIGH (ref 65–99)
Potassium: 3.7 mmol/L (ref 3.5–5.1)
SODIUM: 139 mmol/L (ref 135–145)

## 2015-09-02 LAB — CBG MONITORING, ED: Glucose-Capillary: 106 mg/dL — ABNORMAL HIGH (ref 65–99)

## 2015-09-02 LAB — I-STAT BETA HCG BLOOD, ED (MC, WL, AP ONLY)

## 2015-09-02 LAB — I-STAT CREATININE, ED: CREATININE: 0.7 mg/dL (ref 0.44–1.00)

## 2015-09-02 MED ORDER — NAPROXEN 500 MG PO TABS: 500.0000 mg | ORAL_TABLET | Freq: Two times a day (BID) | ORAL | Status: DC

## 2015-09-02 MED ORDER — SODIUM CHLORIDE 0.9 % IV BOLUS (SEPSIS)
1000.0000 mL | Freq: Once | INTRAVENOUS | Status: AC
Start: 2015-09-02 — End: 2015-09-02
  Administered 2015-09-02: 1000 mL via INTRAVENOUS

## 2015-09-02 MED ORDER — ONDANSETRON HCL 4 MG/2ML IJ SOLN
4.0000 mg | Freq: Once | INTRAMUSCULAR | Status: AC
  Administered 2015-09-02: 4 mg via INTRAVENOUS
  Filled 2015-09-02: qty 2

## 2015-09-02 MED ORDER — FENTANYL CITRATE (PF) 100 MCG/2ML IJ SOLN
50.0000 ug | Freq: Once | INTRAMUSCULAR | Status: AC
  Administered 2015-09-02: 50 ug via INTRAVENOUS
  Filled 2015-09-02: qty 2

## 2015-09-02 MED ORDER — CYCLOBENZAPRINE HCL 10 MG PO TABS: 10.0000 mg | ORAL_TABLET | Freq: Three times a day (TID) | ORAL | Status: DC | PRN

## 2015-09-02 MED ORDER — KETOROLAC TROMETHAMINE 30 MG/ML IJ SOLN: 30.0000 mg | Freq: Once | INTRAMUSCULAR | Status: DC

## 2015-09-02 MED ORDER — IOPAMIDOL (ISOVUE-370) INJECTION 76%
INTRAVENOUS | Status: AC
  Administered 2015-09-02: 50 mL
  Filled 2015-09-02: qty 50

## 2015-09-02 MED ORDER — KETOROLAC TROMETHAMINE 30 MG/ML IJ SOLN
30.0000 mg | Freq: Once | INTRAMUSCULAR | Status: AC
  Administered 2015-09-02: 30 mg via INTRAVENOUS
  Filled 2015-09-02: qty 1

## 2015-09-02 NOTE — ED Notes (Signed)
CBG 106 
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
## 2015-09-02 NOTE — ED Notes (Signed)
Pt is in stable condition upon d/c and ambulates from ED. 
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
## 2015-09-02 NOTE — ED Notes (Signed)
Patient transported to CT 
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
## 2015-09-15 ENCOUNTER — Other Ambulatory Visit: Payer: Medicaid Other

## 2015-11-12 ENCOUNTER — Other Ambulatory Visit: Payer: Self-pay | Admitting: Internal Medicine

## 2015-11-12 DIAGNOSIS — N644 Mastodynia: Secondary | ICD-10-CM

## 2015-11-17 ENCOUNTER — Other Ambulatory Visit: Payer: Medicaid Other

## 2015-11-28 ENCOUNTER — Ambulatory Visit
Admission: RE | Admit: 2015-11-28 | Discharge: 2015-11-28 | Disposition: A | Discharge status: Home / Self Care | Payer: Medicaid Other | Source: Ambulatory Visit | Attending: Internal Medicine | Admitting: Internal Medicine

## 2015-11-28 DIAGNOSIS — N644 Mastodynia: Secondary | ICD-10-CM

## 2015-12-28 IMAGING — CR DG RIBS W/ CHEST 3+V*R*
3 series · 3 of 3 positions shown · non-contrast
Comparison: Chest radiographs dated 02/21/2013

CLINICAL DATA: Right rib pain

EXAM:
RIGHT RIBS AND CHEST - 3+ VIEW

[view not recorded (1 of 3)]
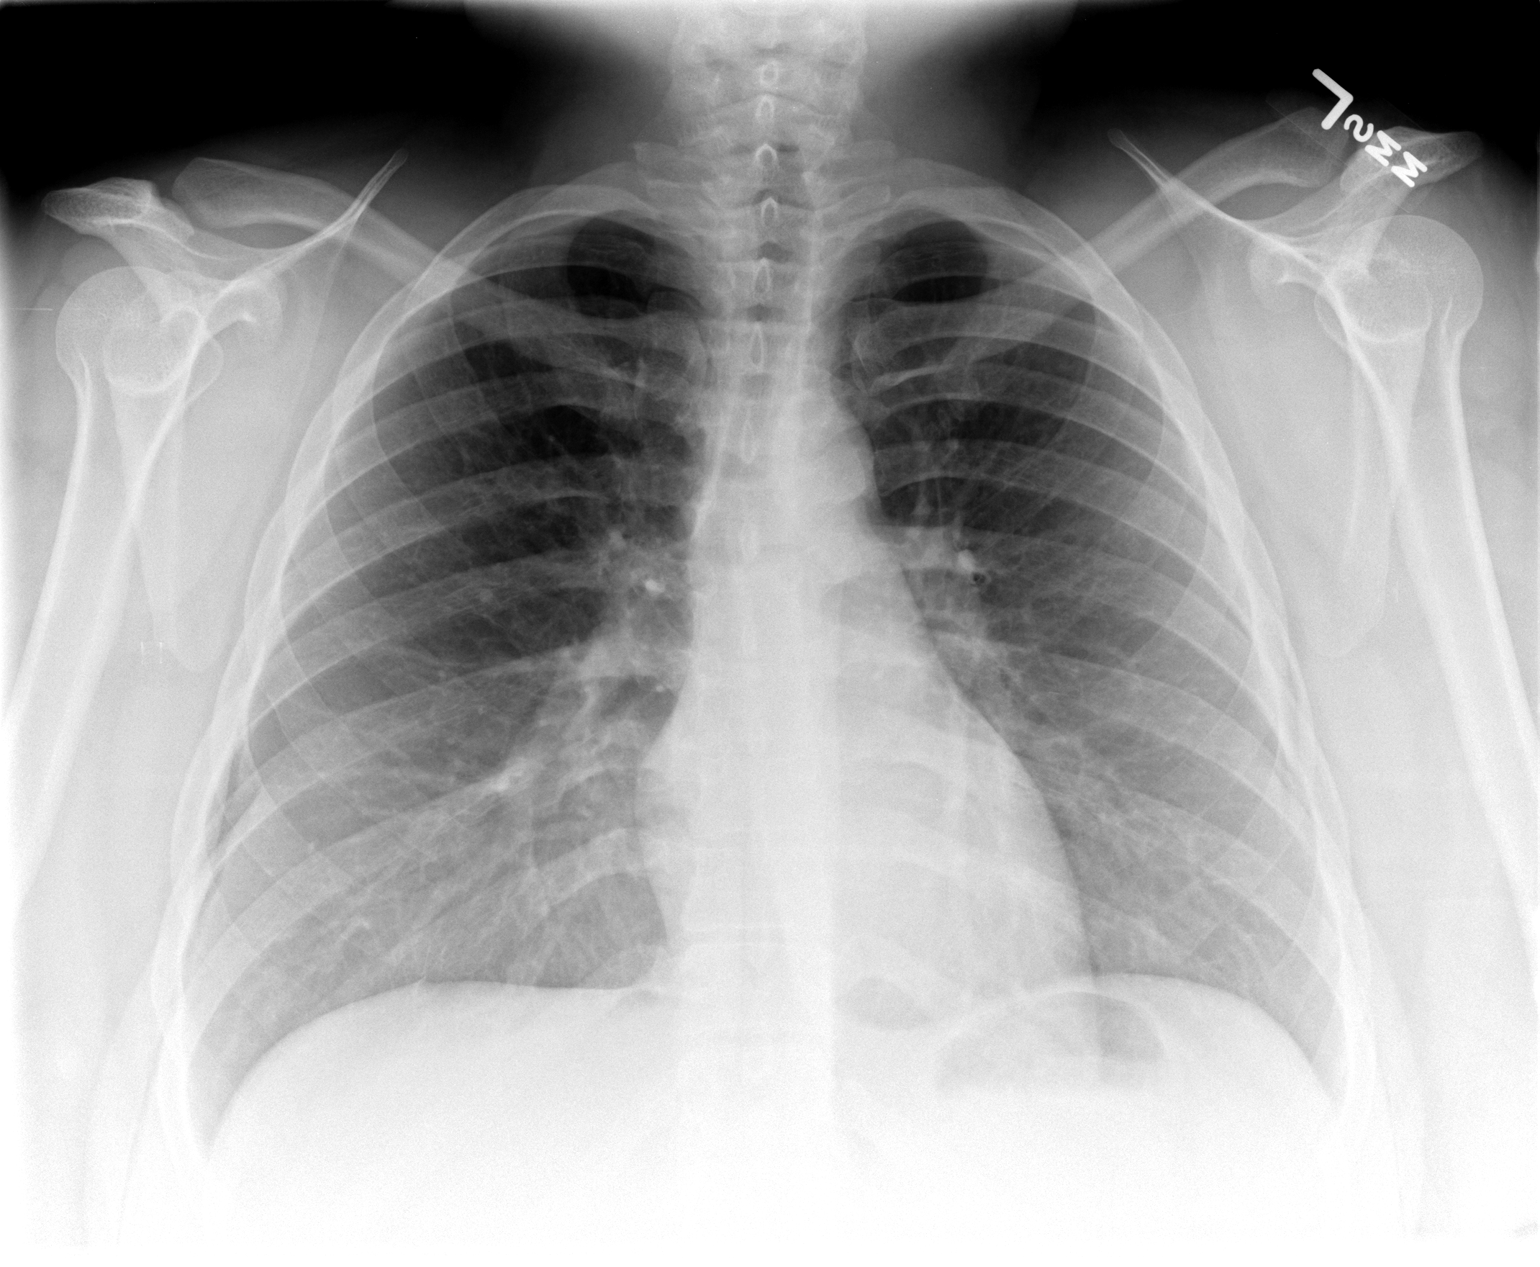

[view not recorded (2 of 3)]
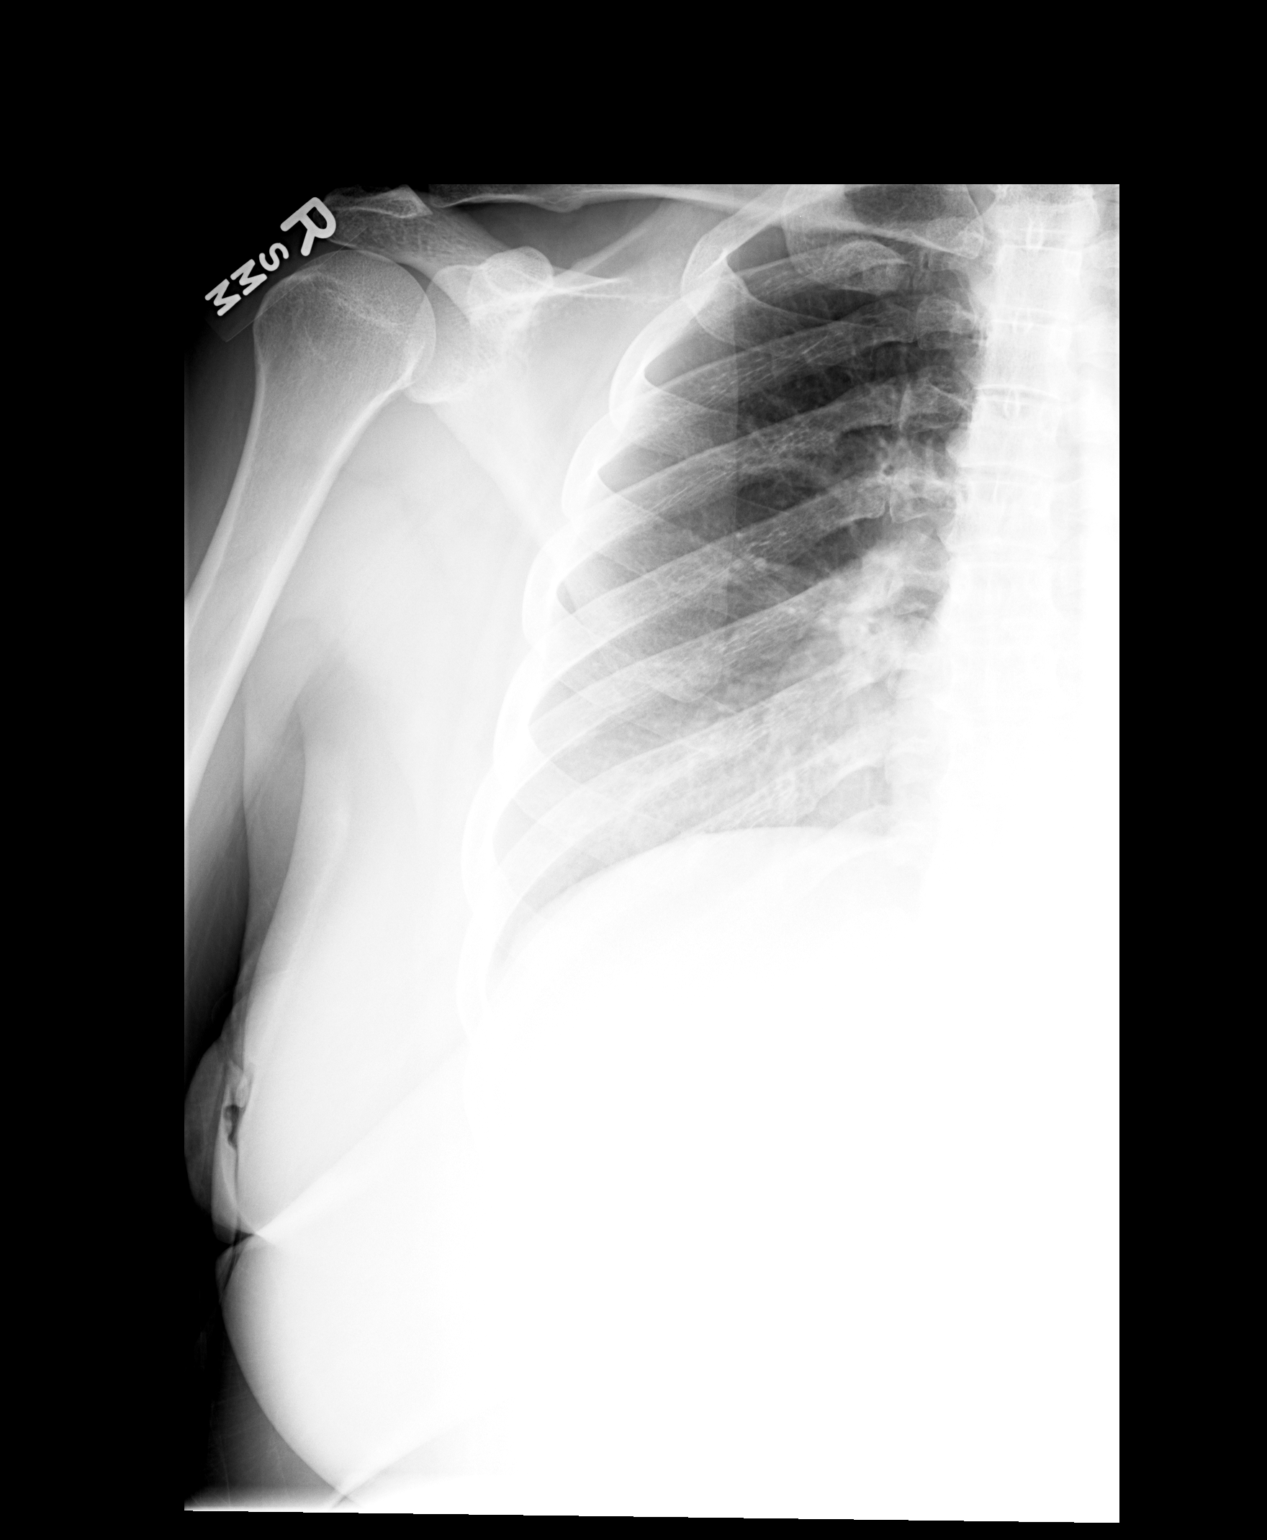

[view not recorded (3 of 3)]
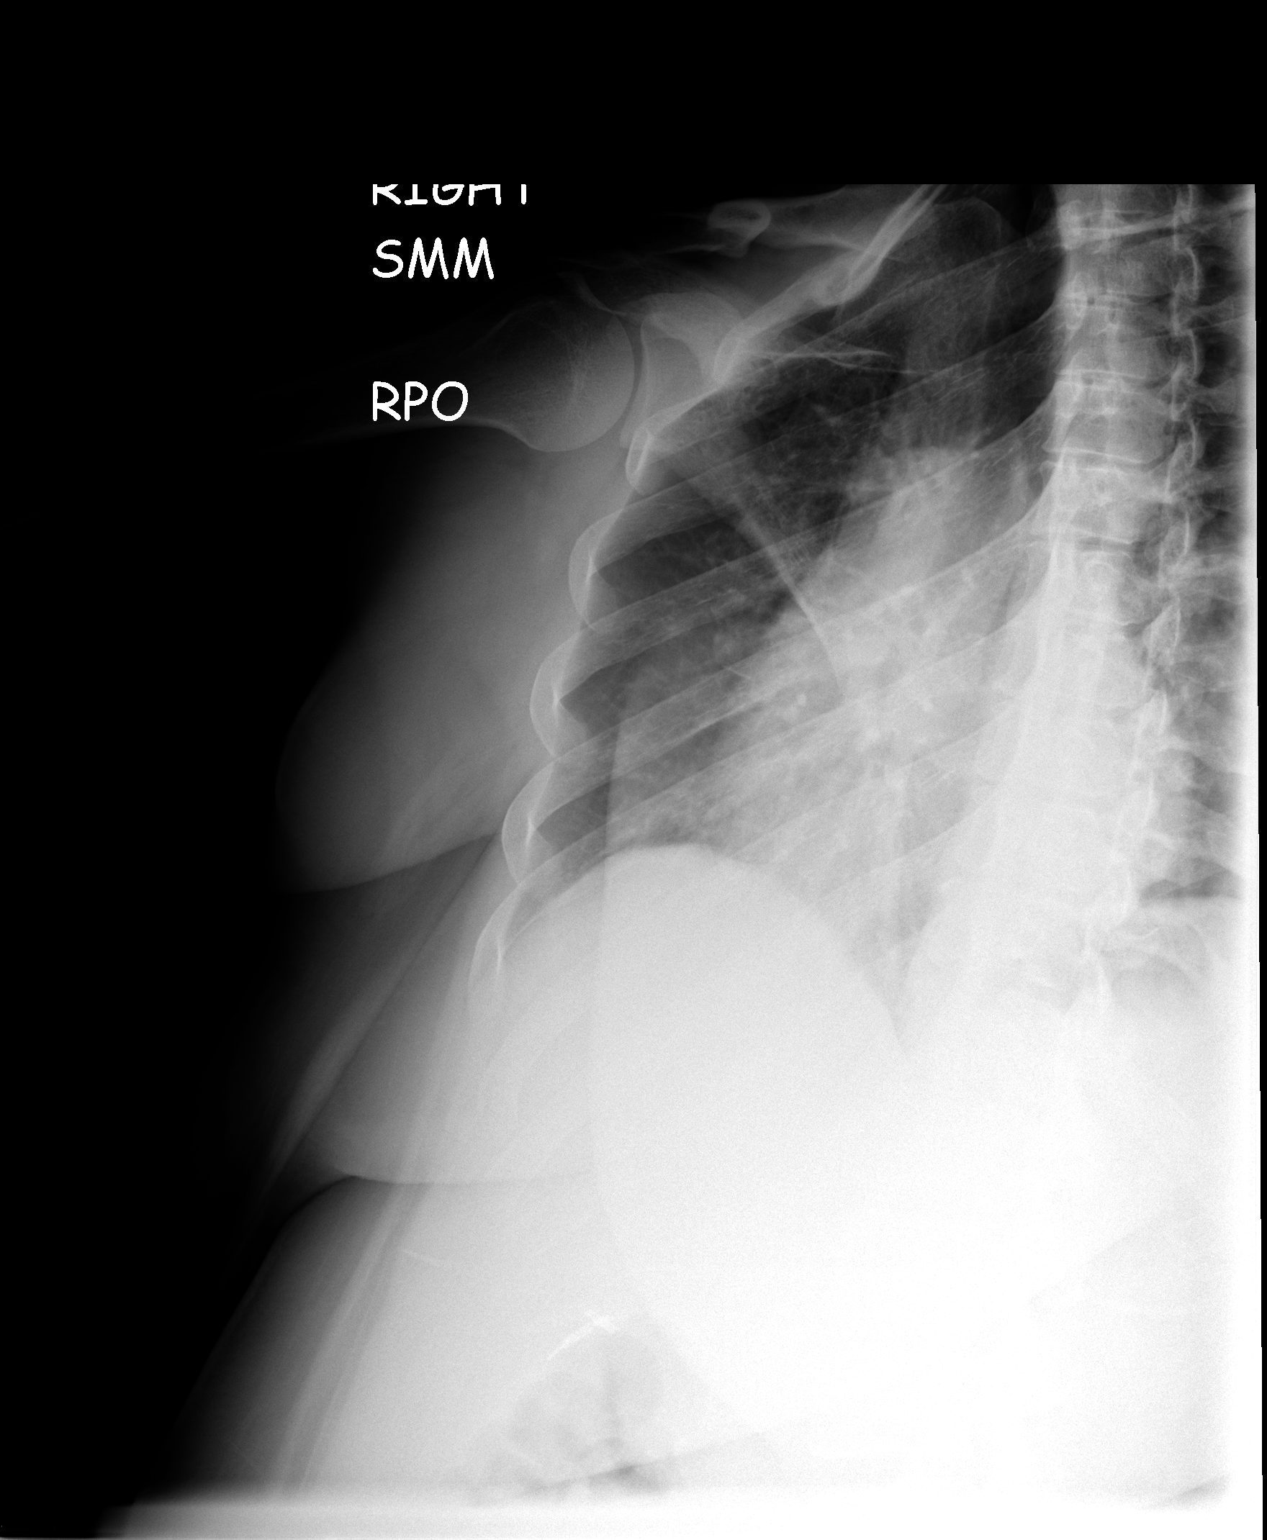

[3 of 3 positions shown; findings below may reference images not displayed]

FINDINGS: Lungs are clear.  No pleural effusion or pneumothorax.

The heart is normal in size

No displaced right rib fracture is seen.
IMPRESSION: No evidence of acute cardiopulmonary disease.

No displaced right rib fracture is seen.

## 2016-03-18 ENCOUNTER — Other Ambulatory Visit: Payer: Self-pay | Admitting: Surgical Oncology

## 2016-03-18 DIAGNOSIS — G8929 Other chronic pain: Secondary | ICD-10-CM

## 2016-03-18 DIAGNOSIS — M5440 Lumbago with sciatica, unspecified side: Secondary | ICD-10-CM

## 2016-03-18 DIAGNOSIS — F17200 Nicotine dependence, unspecified, uncomplicated: Secondary | ICD-10-CM

## 2016-11-04 ENCOUNTER — Encounter (HOSPITAL_COMMUNITY): Payer: Self-pay | Admitting: Emergency Medicine

## 2016-11-04 DIAGNOSIS — Z5321 Procedure and treatment not carried out due to patient leaving prior to being seen by health care provider: Secondary | ICD-10-CM | POA: Diagnosis not present

## 2016-11-04 DIAGNOSIS — R109 Unspecified abdominal pain: Secondary | ICD-10-CM | POA: Diagnosis not present

## 2016-11-04 LAB — COMPREHENSIVE METABOLIC PANEL
ALBUMIN: 3.5 g/dL (ref 3.5–5.0)
ALT: 15 U/L (ref 14–54)
AST: 17 U/L (ref 15–41)
Alkaline Phosphatase: 56 U/L (ref 38–126)
Anion gap: 9 (ref 5–15)
BUN: 9 mg/dL (ref 6–20)
CHLORIDE: 104 mmol/L (ref 101–111)
CO2: 27 mmol/L (ref 22–32)
CREATININE: 0.86 mg/dL (ref 0.44–1.00)
Calcium: 9.3 mg/dL (ref 8.9–10.3)
GFR calc Af Amer: >60 mL/min (ref >60)
GLUCOSE: 112 mg/dL — AB (ref 65–99)
POTASSIUM: 4.3 mmol/L (ref 3.5–5.1)
Sodium: 140 mmol/L (ref 135–145)
Total Bilirubin: 0.4 mg/dL (ref 0.3–1.2)
Total Protein: 6.9 g/dL (ref 6.5–8.1)

## 2016-11-04 LAB — URINALYSIS, ROUTINE W REFLEX MICROSCOPIC
BACTERIA UA: NONE SEEN
BILIRUBIN URINE: NEGATIVE
Glucose, UA: NEGATIVE mg/dL
Hgb urine dipstick: NEGATIVE
Ketones, ur: NEGATIVE mg/dL
Nitrite: NEGATIVE
PH: 5 (ref 5.0–8.0)
Protein, ur: 30 mg/dL — AB
SPECIFIC GRAVITY, URINE: 1.028 (ref 1.005–1.030)

## 2016-11-04 LAB — CBC
HEMATOCRIT: 41.4 % (ref 36.0–46.0)
Hemoglobin: 13.8 g/dL (ref 12.0–15.0)
MCH: 28.9 pg (ref 26.0–34.0)
MCHC: 33.3 g/dL (ref 30.0–36.0)
MCV: 86.6 fL (ref 78.0–100.0)
PLATELETS: 282 10*3/uL (ref 150–400)
RBC: 4.78 MIL/uL (ref 3.87–5.11)
RDW: 13.4 % (ref 11.5–15.5)
WBC: 9.6 10*3/uL (ref 4.0–10.5)

## 2016-11-04 LAB — I-STAT BETA HCG BLOOD, ED (MC, WL, AP ONLY)

## 2016-11-04 LAB — LIPASE, BLOOD: LIPASE: 32 U/L (ref 11–51)

## 2016-11-04 MED ORDER — ONDANSETRON 4 MG PO TBDP: 4.0000 mg | ORAL_TABLET | Freq: Once | ORAL | Status: DC | PRN

## 2016-11-04 MED ORDER — ONDANSETRON 4 MG PO TBDP
ORAL_TABLET | ORAL | Status: AC
  Administered 2016-11-04: 4 mg
  Filled 2016-11-04: qty 1
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
## 2016-11-05 ENCOUNTER — Emergency Department (HOSPITAL_COMMUNITY)
Admission: EM | Admit: 2016-11-05 | Discharge: 2016-11-05 | Disposition: A | Discharge status: Home / Self Care | Payer: Medicaid Other | Attending: Emergency Medicine | Admitting: Emergency Medicine

## 2016-11-05 ENCOUNTER — Emergency Department (HOSPITAL_COMMUNITY): Payer: Medicaid Other

## 2016-11-05 ENCOUNTER — Encounter (HOSPITAL_COMMUNITY): Payer: Self-pay | Admitting: *Deleted

## 2016-11-05 DIAGNOSIS — R51 Headache: Secondary | ICD-10-CM | POA: Diagnosis present

## 2016-11-05 DIAGNOSIS — S161XXA Strain of muscle, fascia and tendon at neck level, initial encounter: Secondary | ICD-10-CM | POA: Diagnosis not present

## 2016-11-05 DIAGNOSIS — Z79899 Other long term (current) drug therapy: Secondary | ICD-10-CM | POA: Diagnosis not present

## 2016-11-05 DIAGNOSIS — Y92410 Unspecified street and highway as the place of occurrence of the external cause: Secondary | ICD-10-CM | POA: Insufficient documentation

## 2016-11-05 DIAGNOSIS — Y9389 Activity, other specified: Secondary | ICD-10-CM | POA: Diagnosis not present

## 2016-11-05 DIAGNOSIS — K5900 Constipation, unspecified: Secondary | ICD-10-CM | POA: Insufficient documentation

## 2016-11-05 DIAGNOSIS — R1010 Upper abdominal pain, unspecified: Secondary | ICD-10-CM | POA: Insufficient documentation

## 2016-11-05 DIAGNOSIS — Z9049 Acquired absence of other specified parts of digestive tract: Secondary | ICD-10-CM | POA: Insufficient documentation

## 2016-11-05 DIAGNOSIS — F17210 Nicotine dependence, cigarettes, uncomplicated: Secondary | ICD-10-CM | POA: Diagnosis not present

## 2016-11-05 DIAGNOSIS — F1210 Cannabis abuse, uncomplicated: Secondary | ICD-10-CM | POA: Diagnosis not present

## 2016-11-05 DIAGNOSIS — Y998 Other external cause status: Secondary | ICD-10-CM | POA: Diagnosis not present

## 2016-11-05 DIAGNOSIS — R519 Headache, unspecified: Secondary | ICD-10-CM

## 2016-11-05 MED ORDER — KETOROLAC TROMETHAMINE 15 MG/ML IJ SOLN
15.0000 mg | Freq: Once | INTRAMUSCULAR | Status: AC
  Administered 2016-11-05: 15 mg via INTRAVENOUS
  Filled 2016-11-05: qty 1

## 2016-11-05 MED ORDER — CYCLOBENZAPRINE HCL 10 MG PO TABS: 10.0000 mg | ORAL_TABLET | Freq: Two times a day (BID) | ORAL | 0 refills | Status: DC | PRN

## 2016-11-05 MED ORDER — PROCHLORPERAZINE EDISYLATE 5 MG/ML IJ SOLN
10.0000 mg | Freq: Once | INTRAMUSCULAR | Status: AC
  Administered 2016-11-05: 10 mg via INTRAVENOUS
  Filled 2016-11-05: qty 2

## 2016-11-05 MED ORDER — DIPHENHYDRAMINE HCL 50 MG/ML IJ SOLN
50.0000 mg | Freq: Once | INTRAMUSCULAR | Status: AC
  Administered 2016-11-05: 50 mg via INTRAVENOUS
  Filled 2016-11-05: qty 1

## 2016-11-05 MED ORDER — IOPAMIDOL (ISOVUE-300) INJECTION 61%
INTRAVENOUS | Status: AC
  Administered 2016-11-05: 100 mL
  Filled 2016-11-05: qty 100

## 2016-11-05 MED ORDER — SODIUM CHLORIDE 0.9 % IV BOLUS (SEPSIS)
1000.0000 mL | Freq: Once | INTRAVENOUS | Status: AC
  Administered 2016-11-05: 1000 mL via INTRAVENOUS

## 2016-11-05 MED ORDER — POLYETHYLENE GLYCOL 3350 17 GM/SCOOP PO POWD: 1.0000 | Freq: Once | ORAL | 0 refills | Status: AC

## 2016-11-05 MED ORDER — DEXAMETHASONE SODIUM PHOSPHATE 10 MG/ML IJ SOLN
10.0000 mg | Freq: Once | INTRAMUSCULAR | Status: AC
  Administered 2016-11-05: 10 mg via INTRAVENOUS
  Filled 2016-11-05: qty 1

## 2016-11-05 MED ORDER — KETOROLAC TROMETHAMINE 60 MG/2ML IM SOLN
30.0000 mg | Freq: Once | INTRAMUSCULAR | Status: DC
  Filled 2016-11-05: qty 2

## 2016-11-05 MED ORDER — DOCUSATE SODIUM 250 MG PO CAPS: 250.0000 mg | ORAL_CAPSULE | Freq: Every day | ORAL | 0 refills | Status: DC
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
## 2016-11-05 NOTE — ED Notes (Signed)
ED Provider at bedside. 

## 2016-11-05 NOTE — ED Notes (Signed)
Pt called no answer 

## 2016-11-05 NOTE — ED Notes (Signed)
Patient transported to CT 

## 2016-11-05 NOTE — ED Notes (Signed)
Patient C/O decreased appetite.
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
## 2016-11-05 NOTE — Discharge Instructions (Signed)
Start taking stool softener and MiraLAX as prescribed for the next few days. You may take Flexeril twice daily as needed for muscle spasms but be aware this medication may make you drowsy. If it does, no driving, drinking alcohol, or operating heavy machinery. Apply ice or heat to the affected areas for comfort. Alternate 600 mg of ibuprofen and [phone] mg of Tylenol every 3 hours as needed for pain. Do not exceed 4000 mg of Tylenol daily. Take frequent short walks and do some gentle stretching to avoid muscle stiffness. Follow up with your primary care physician for reevaluation of your abdominal pain and neck pain and headaches. Return to the ED immediately if any concerning signs or symptoms develop such as passing out, loss of control of bowel or bladder, blood in your urine or stool, or other concerning symptoms.

## 2017-05-25 DIAGNOSIS — M25561 Pain in right knee: Secondary | ICD-10-CM | POA: Diagnosis not present

## 2017-05-25 DIAGNOSIS — M545 Low back pain: Secondary | ICD-10-CM | POA: Diagnosis not present

## 2017-05-25 DIAGNOSIS — M25562 Pain in left knee: Secondary | ICD-10-CM | POA: Diagnosis not present

## 2017-10-18 DIAGNOSIS — Z1339 Encounter for screening examination for other mental health and behavioral disorders: Secondary | ICD-10-CM | POA: Diagnosis not present

## 2017-10-18 DIAGNOSIS — Z114 Encounter for screening for human immunodeficiency virus [HIV]: Secondary | ICD-10-CM | POA: Diagnosis not present

## 2017-10-18 DIAGNOSIS — E559 Vitamin D deficiency, unspecified: Secondary | ICD-10-CM | POA: Diagnosis not present

## 2017-10-18 DIAGNOSIS — Z0000 Encounter for general adult medical examination without abnormal findings: Secondary | ICD-10-CM | POA: Diagnosis not present

## 2017-10-18 DIAGNOSIS — R5383 Other fatigue: Secondary | ICD-10-CM | POA: Diagnosis not present

## 2017-10-18 DIAGNOSIS — M129 Arthropathy, unspecified: Secondary | ICD-10-CM | POA: Diagnosis not present

## 2017-10-18 DIAGNOSIS — N926 Irregular menstruation, unspecified: Secondary | ICD-10-CM | POA: Diagnosis not present

## 2017-10-18 DIAGNOSIS — R0602 Shortness of breath: Secondary | ICD-10-CM | POA: Diagnosis not present

## 2017-10-18 DIAGNOSIS — Z1331 Encounter for screening for depression: Secondary | ICD-10-CM | POA: Diagnosis not present

## 2017-10-18 DIAGNOSIS — Z79899 Other long term (current) drug therapy: Secondary | ICD-10-CM | POA: Diagnosis not present

## 2017-12-11 ENCOUNTER — Encounter (HOSPITAL_COMMUNITY): Payer: Self-pay | Admitting: *Deleted

## 2017-12-11 ENCOUNTER — Ambulatory Visit (HOSPITAL_COMMUNITY)
Admission: EM | Admit: 2017-12-11 | Discharge: 2017-12-11 | Disposition: A | Discharge status: Home / Self Care | Payer: Self-pay | Attending: Internal Medicine | Admitting: Internal Medicine

## 2017-12-11 DIAGNOSIS — M62830 Muscle spasm of back: Secondary | ICD-10-CM

## 2017-12-11 DIAGNOSIS — M62838 Other muscle spasm: Secondary | ICD-10-CM

## 2017-12-11 DIAGNOSIS — S161XXA Strain of muscle, fascia and tendon at neck level, initial encounter: Secondary | ICD-10-CM

## 2017-12-11 DIAGNOSIS — M542 Cervicalgia: Secondary | ICD-10-CM

## 2017-12-11 DIAGNOSIS — R51 Headache: Secondary | ICD-10-CM

## 2017-12-11 MED ORDER — KETOROLAC TROMETHAMINE 60 MG/2ML IM SOLN
60.0000 mg | Freq: Once | INTRAMUSCULAR | Status: AC
  Administered 2017-12-11: 60 mg via INTRAMUSCULAR

## 2017-12-11 MED ORDER — KETOROLAC TROMETHAMINE 60 MG/2ML IM SOLN
INTRAMUSCULAR | Status: AC
  Filled 2017-12-11: qty 2

## 2017-12-11 MED ORDER — CYCLOBENZAPRINE HCL 10 MG PO TABS: 10.0000 mg | ORAL_TABLET | Freq: Every evening | ORAL | 0 refills | Status: DC | PRN

## 2017-12-11 MED ORDER — NAPROXEN 500 MG PO TABS: 500.0000 mg | ORAL_TABLET | Freq: Two times a day (BID) | ORAL | 0 refills | Status: DC
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
## 2017-12-11 NOTE — Discharge Instructions (Addendum)
Toradol shot given in office Rest, ice and heat as needed Ensure adequate ROM as tolerated. Injuries all appear to be muscular in nature. Prescribed naproxen as needed for inflammation and pain relief Prescribed flexeril as needed at bedtime for muscle spasm.  Do not drive or operate heavy machinery while taking this medication Expect some increased pain in the next 1-3 days.  It may take 3-4 weeks for complete resolution of symptoms Will f/u with her doctor or here if not seeing significant improvement within one week. Return here or go to ER if you have any new or worsening symptoms such as numbness/tingling of the inner thighs, loss of bladder or bowel control, severe headache/blurry vision, nausea/vomiting, confusion/altered mental status, dizziness, weakness, passing out, imbalance, etc...Marland Kitchen

## 2018-05-06 ENCOUNTER — Ambulatory Visit (HOSPITAL_COMMUNITY)
Admission: EM | Admit: 2018-05-06 | Discharge: 2018-05-06 | Disposition: A | Discharge status: Home / Self Care | Payer: Medicaid Other | Attending: Internal Medicine | Admitting: Internal Medicine

## 2018-05-06 ENCOUNTER — Encounter (HOSPITAL_COMMUNITY): Payer: Self-pay

## 2018-05-06 DIAGNOSIS — H5789 Other specified disorders of eye and adnexa: Secondary | ICD-10-CM

## 2018-05-06 DIAGNOSIS — J029 Acute pharyngitis, unspecified: Secondary | ICD-10-CM | POA: Diagnosis not present

## 2018-05-06 DIAGNOSIS — G8929 Other chronic pain: Secondary | ICD-10-CM | POA: Diagnosis not present

## 2018-05-06 DIAGNOSIS — M67432 Ganglion, left wrist: Secondary | ICD-10-CM | POA: Diagnosis not present

## 2018-05-06 DIAGNOSIS — M25561 Pain in right knee: Secondary | ICD-10-CM

## 2018-05-06 LAB — POCT RAPID STREP A: Streptococcus, Group A Screen (Direct): NEGATIVE

## 2018-05-06 MED ORDER — KETOROLAC TROMETHAMINE 60 MG/2ML IM SOLN
60.0000 mg | Freq: Once | INTRAMUSCULAR | Status: AC
  Administered 2018-05-06: 60 mg via INTRAMUSCULAR

## 2018-05-06 MED ORDER — NAPROXEN 500 MG PO TABS: 500.0000 mg | ORAL_TABLET | Freq: Two times a day (BID) | ORAL | 0 refills | Status: DC

## 2018-05-06 MED ORDER — OLOPATADINE HCL 0.1 % OP SOLN: 1.0000 [drp] | Freq: Two times a day (BID) | OPHTHALMIC | 0 refills | Status: DC

## 2018-05-06 MED ORDER — KETOROLAC TROMETHAMINE 60 MG/2ML IM SOLN
INTRAMUSCULAR | Status: AC
  Filled 2018-05-06: qty 2

## 2018-05-06 MED ORDER — CETIRIZINE HCL 10 MG PO CAPS: 10.0000 mg | ORAL_CAPSULE | Freq: Every day | ORAL | 0 refills | Status: DC

## 2018-05-06 NOTE — Discharge Instructions (Addendum)
Knee: Use anti-inflammatories for pain/swelling. You may take up to 800 mg Ibuprofen every 8 hours with food. You may supplement Ibuprofen with Tylenol [phone] mg every 8 hours.  OR Naprosyn twice daily Ice and elevate  Eye: Use eye drop twice daily as needed for itching  Sore Throat  Your rapid strep tested Negative today. We will send for a culture and call in about 2 days if results are positive. For now we will treat your sore throat as a virus with symptom management.   Please continue Tylenol or Ibuprofen for fever and pain. May try salt water gargles, cepacol lozenges, throat spray, or OTC cold relief medicine for throat discomfort. If you also have congestion take a daily anti-histamine like Zyrtec, Claritin, and a oral decongestant to help with post nasal drip that may be irritating your throat.   Stay hydrated and drink plenty of fluids to keep your throat coated relieve irritation.

## 2018-05-06 NOTE — ED Triage Notes (Signed)
Pt presents with left eye irritation, a recurrent abscess on her left wrist, and right knee pain not associated with any injury.

## 2018-05-07 NOTE — ED Provider Notes (Signed)
[facility]    CSN: [id number] Arrival date & time: 05/06/18  1018     History   Chief Complaint Chief Complaint  Patient presents with  . Eye Problem    Left Eye  . Abscess    Left Wrist  . Knee Pain    Right    HPI Vanessa Donaldson is a 38 y.o. female history of tobacco use presenting today for multiple complaints-left eye irritation, sore throat, cyst to wrist, knee pain and back pain.  Patient has noticed over the past couple days she has had left eye irritation, noticed it being mildly red and occasionally itchy.  Denies any changes in vision or denies eye pain.  Denies any discharge or drainage from the eye.  Does not wear contacts.  She has not used anything in this eye.  Has had mild associated nasal congestion.  She is also concerned about a sore throat, she has had sore throat over the past few days as well.  She is concerned as it feels irritated and there is and as if something is blocking her throat.  She is concerned about throat cancer as she is a tobacco user.  She is not taking anything for her symptoms.  She also notes that she has a cyst to her left wrist, she has had this previously and it will come and go.  She is previously had it drained and is hoping to have it drained today.  It causes her minimal pain, but the swelling does bother her and feels slightly irritated.  Last time she had it drained was approximately a few years ago.  She also notes that she has chronic right knee pain.  This is been going on for over a year.  She denies any injury.  Denies any awkward twisting of her knee.  She has not taken anything for symptoms of recently, but in the past has tried ibuprofen and prednisone.  She does not wish to proceed with any further prednisone.  She also has had similar back pain in her right mid back that has been persistent as well with the knee pain.  Denies any injury or increase in activity.  Denies difficulty moving her right arm or neck.  She  does not take anything for the symptoms.  Patient does not have a primary care.  HPI  Past Medical History:  Diagnosis Date  . Chlamydia   . Chronic back pain   . Chronic neck pain   . Complication of anesthesia    perceived problem with epidural  . Drug abuse (HCC)    marijuana  . Gall stones   . Hypercholesterolemia   . Obesity   . Smoker     Patient Active Problem List   Diagnosis Date Noted  . Thoracic spine pain 08/24/2013  . S/P laparoscopic cholecystectomy August 2014 11/20/2012  . Chronic back pain 01/04/2011  . Drug abuse (HCC) 01/04/2011  . History of chlamydia 01/04/2011  . Active smoker 01/04/2011  . Obesity 01/04/2011    Past Surgical History:  Procedure Laterality Date  . BTL    . CHOLECYSTECTOMY N/A 11/20/2012   Procedure: LAPAROSCOPIC CHOLECYSTECTOMY WITH INTRAOPERATIVE CHOLANGIOGRAM;  Surgeon: Valarie Merino, MD;  Location: [facility];  Service: General;  Laterality: N/A;  . RECTAL EXAM UNDER ANESTHESIA  11/20/2012   Procedure: RECTAL EXAM UNDER ANESTHESIA;  Surgeon: Valarie Merino, MD;  Location: [facility];  Service: General;;  . TUBAL LIGATION     had  a baby since BTL    OB History    Gravida  5   Para  5   Term  5   Preterm      AB      Living  5     SAB      TAB      Ectopic      Multiple      Live Births               Home Medications    Prior to Admission medications   Medication Sig Start Date End Date Taking? Authorizing Provider  Cetirizine HCl 10 MG CAPS Take 1 capsule (10 mg total) by mouth daily for 10 days. 05/06/18 05/16/18  Wieters, Hallie C, PA-C  cyclobenzaprine (FLEXERIL) 10 MG tablet Take 1 tablet (10 mg total) by mouth at bedtime as needed for muscle spasms. 12/11/17   Wurst, [place], PA-C  HYDROcodone-acetaminophen (NORCO) 10-325 MG tablet Take 1 tablet by mouth every 6 (six) hours as needed.    [provider]  naproxen (NAPROSYN) 500 MG tablet Take 1 tablet (500 mg total) by mouth 2 (two) times  daily. 05/06/18   Wieters, Hallie C, PA-C  olopatadine (PATANOL) 0.1 % ophthalmic solution Place 1 drop into the left eye 2 (two) times daily. 05/06/18   Wieters, Junius CreamerHallie C, PA-C    Family History Family History  Problem Relation Age of Onset  . Edema Mother   . Arthritis Mother   . Emphysema Mother   . Pancreatic cancer Other        Mat. [employer] Grandmother  . Cancer Brother        Stage 1    Social History Social History   Tobacco Use  . Smoking status: Current Every Day Smoker    Packs/day: 0.25    Years: 8.00    Pack years: 2.00    Types: Cigarettes  . Smokeless tobacco: Never Used  Substance Use Topics  . Alcohol use: Yes    Comment: occasional  . Drug use: Not on file    Comment: denies     Allergies   Patient has no known allergies.   Review of Systems Review of Systems  Constitutional: Negative for activity change, appetite change, chills, fatigue and fever.  HENT: Positive for congestion, rhinorrhea and sore throat. Negative for ear pain, sinus pressure and trouble swallowing.   Eyes: Positive for redness and itching. Negative for photophobia, pain, discharge and visual disturbance.  Respiratory: Negative for cough, chest tightness and shortness of breath.   Cardiovascular: Negative for chest pain.  Gastrointestinal: Negative for abdominal pain, diarrhea, nausea and vomiting.  Genitourinary: Negative for decreased urine volume and hematuria.  Musculoskeletal: Positive for arthralgias, back pain, joint swelling and myalgias. Negative for neck pain and neck stiffness.  Skin: Negative for rash.  Neurological: Negative for dizziness, syncope, facial asymmetry, speech difficulty, weakness, light-headedness, numbness and headaches.     Physical Exam Triage Vital Signs ED Triage Vitals [05/06/18 1102]  Enc Vitals Group     BP 111/85     Pulse Rate 76     Resp 20     Temp 98.3 F (36.8 C)     Temp Source Oral     SpO2 99 %     Weight      Height      Head  Circumference      Peak Flow      Pain Score 7     Pain  Loc      Pain Edu?      Excl. in [place]?    No data found.  Updated Vital Signs BP 111/85 (BP Location: Left Arm)   Pulse 76   Temp 98.3 F (36.8 C) (Oral)   Resp 20   LMP 05/02/2018   SpO2 99%   Visual Acuity Right Eye Distance:  20/30 Left Eye Distance:  20/30 Bilateral Distance:  20/30  Right Eye Near:   Left Eye Near:    Bilateral Near:     Physical Exam Vitals signs and nursing note reviewed.  Constitutional:      General: She is not in acute distress.    Appearance: She is well-developed.  HENT:     Head: Normocephalic and atraumatic.     Ears:     Comments: Bilateral ears without tenderness to palpation of external auricle, tragus and mastoid, EAC's without erythema or swelling, TM's with good bony landmarks and cone of light. Non erythematous.    Nose:     Comments: Swollen turbinates with rhinorrhea present    Mouth/Throat:     Comments: Oral mucosa pink and moist, no tonsillar enlargement or exudate. Posterior pharynx patent and nonerythematous, no uvula deviation or swelling. Normal phonation. Eyes:     Conjunctiva/sclera: Conjunctivae normal.  Neck:     Musculoskeletal: Neck supple.  Cardiovascular:     Rate and Rhythm: Normal rate and regular rhythm.     Heart sounds: No murmur.  Pulmonary:     Effort: Pulmonary effort is normal. No respiratory distress.     Breath sounds: Normal breath sounds.     Comments: Breathing comfortably at rest, CTABL, no wheezing, rales or other adventitious sounds auscultated Abdominal:     Palpations: Abdomen is soft.     Tenderness: There is no abdominal tenderness.  Musculoskeletal:     Comments: Right knee: Full active range of motion of knee, tenderness to palpation over lateral aspect of knee, nontender over patella or medial aspect, minimal swelling compared to left, no varus or valgus laxity, negative Lockman, negative McMurray  Nontender to palpation of  cervical, thoracic and lumbar spine, mild tenderness throughout right trapezius and thoracic musculature  Active range of motion of right shoulder  Gait with minimal abnormality  Skin:    General: Skin is warm and dry.     Comments: Approximately 2 cm x 2 cm ganglion cyst to dorsum of left wrist, no overlying erythema, nontender, slightly mobile  Neurological:     Mental Status: She is alert.      UC Treatments / Results  Labs (all labs ordered are listed, but only abnormal results are displayed) Labs Reviewed  CULTURE, GROUP A STREP [facility])  POCT RAPID STREP A    EKG None  Radiology No results found.  Procedures Incision and Drainage Date/Time: 05/06/2018 11:25 AM Performed by: Wieters, Junius Creamer, PA-C Authorized by: Arnaldo Natal, MD   Consent:    Consent obtained:  Verbal   Consent given by:  Patient   Risks discussed:  Bleeding, pain and incomplete drainage   Alternatives discussed:  No treatment Location:    Type:  Ganglion cyst   Size:  2 cm   Location:  Upper extremity   Upper extremity location:  Wrist   Wrist location:  L wrist Pre-procedure details:    Procedure prep: Alcohol. Anesthesia (see MAR for exact dosages):    Anesthesia method:  Local infiltration   Local anesthetic:  Lidocaine 1% w/o epi  Procedure type:    Complexity:  Simple Procedure details:    Needle aspiration: yes     Needle size:  18 G   Drainage characteristics: 1 mL of thick yellowish clear drainage. Post-procedure details:    Patient tolerance of procedure:  Tolerated well, no immediate complications   (including critical care time)  Medications Ordered in UC Medications  ketorolac (TORADOL) injection 60 mg (60 mg Intramuscular Given 05/06/18 1208)    Initial Impression / Assessment and Plan / UC Course  I have reviewed the triage vital signs and the nursing notes.  Pertinent labs & imaging results that were available during my care of the patient were reviewed by me  and considered in my medical decision making (see chart for details).    Drainage of left wrist ganglion cyst with improvement of swelling, will have patient continue to monitor.  I will likely with allergic versus viral conjunctivitis, will treat with olopatadine.  Visual acuity intact.  Strep test negative, sore throat most likely from postnasal drainage versus viral etiology.  Will treat symptomatically and supportively.  Recommended daily Zyrtec.  Discussed less likely throat cancer, but if symptoms persisting without improvement she may need to follow-up with ENT for further evaluation as throat cancer cannot be fully ruled out at the urgent care.  Knee pain and back pain without associated injury, most likely more inflammatory/strains.  Will treat conservatively with anti-inflammatories.  Provided Toradol prior to discharge.  Deferring imaging given lack of injury.  Discussed with patient given many complaints and concerns that she should establish care with a primary care, contact information given for this as it is very difficult to fully address multiple complaints at the urgent care.  May have underlying arthritis and knee may benefit from injections in the future, and further referrals as needed for other complaints.  Discussed strict return precautions. Patient verbalized understanding and is agreeable with plan.   Final Clinical Impressions(s) / UC Diagnoses   Final diagnoses:  Sore throat  Eye irritation  Ganglion cyst of dorsum of left wrist  Chronic pain of right knee     Discharge Instructions     Knee: Use anti-inflammatories for pain/swelling. You may take up to 800 mg Ibuprofen every 8 hours with food. You may supplement Ibuprofen with Tylenol [phone] mg every 8 hours.  OR Naprosyn twice daily Ice and elevate  Eye: Use eye drop twice daily as needed for itching  Sore Throat  Your rapid strep tested Negative today. We will send for a culture and call in about  2 days if results are positive. For now we will treat your sore throat as a virus with symptom management.   Please continue Tylenol or Ibuprofen for fever and pain. May try salt water gargles, cepacol lozenges, throat spray, or OTC cold relief medicine for throat discomfort. If you also have congestion take a daily anti-histamine like Zyrtec, Claritin, and a oral decongestant to help with post nasal drip that may be irritating your throat.   Stay hydrated and drink plenty of fluids to keep your throat coated relieve irritation.    ED Prescriptions    Medication Sig Dispense Auth. Provider   Cetirizine HCl 10 MG CAPS Take 1 capsule (10 mg total) by mouth daily for 10 days. 10 capsule Wieters, Hallie C, PA-C   olopatadine (PATANOL) 0.1 % ophthalmic solution Place 1 drop into the left eye 2 (two) times daily. 5 mL Wieters, Hallie C, PA-C   naproxen (NAPROSYN) 500 MG  tablet Take 1 tablet (500 mg total) by mouth 2 (two) times daily. 30 tablet Wieters, PurdinHallie C, PA-C     Controlled Substance Prescriptions [place] Controlled Substance Registry consulted? Not Applicable   Lew DawesWieters, Hallie C, [place]PA-C 05/07/18 [id number]

## 2018-05-08 LAB — CULTURE, GROUP A STREP (THRC)

## 2018-11-27 ENCOUNTER — Other Ambulatory Visit: Payer: Self-pay

## 2018-11-27 ENCOUNTER — Encounter (HOSPITAL_COMMUNITY): Payer: Self-pay | Admitting: Emergency Medicine

## 2018-11-27 ENCOUNTER — Ambulatory Visit (HOSPITAL_COMMUNITY)
Admission: EM | Admit: 2018-11-27 | Discharge: 2018-11-27 | Disposition: A | Discharge status: Home / Self Care | Payer: Medicaid Other | Attending: Internal Medicine | Admitting: Internal Medicine

## 2018-11-27 DIAGNOSIS — T7411XA Adult physical abuse, confirmed, initial encounter: Secondary | ICD-10-CM | POA: Diagnosis not present

## 2018-11-27 DIAGNOSIS — F3289 Other specified depressive episodes: Secondary | ICD-10-CM

## 2018-11-27 DIAGNOSIS — S335XXA Sprain of ligaments of lumbar spine, initial encounter: Secondary | ICD-10-CM | POA: Insufficient documentation

## 2018-11-27 LAB — CBC
HCT: 41.5 % (ref 36.0–46.0)
Hemoglobin: 13.9 g/dL (ref 12.0–15.0)
MCH: 29.1 pg (ref 26.0–34.0)
MCHC: 33.5 g/dL (ref 30.0–36.0)
MCV: 86.8 fL (ref 80.0–100.0)
Platelets: 296 10*3/uL (ref 150–400)
RBC: 4.78 MIL/uL (ref 3.87–5.11)
RDW: 13.2 % (ref 11.5–15.5)
WBC: 6.5 10*3/uL (ref 4.0–10.5)
nRBC: 0 % (ref 0.0–0.2)

## 2018-11-27 LAB — TSH: TSH: 1.926 u[IU]/mL (ref 0.350–4.500)

## 2018-11-27 LAB — BASIC METABOLIC PANEL
Anion gap: 9 (ref 5–15)
BUN: 7 mg/dL (ref 6–20)
CO2: 22 mmol/L (ref 22–32)
Calcium: 9.1 mg/dL (ref 8.9–10.3)
Chloride: 107 mmol/L (ref 98–111)
Creatinine, Ser: 0.65 mg/dL (ref 0.44–1.00)
GFR calc Af Amer: >60 mL/min (ref >60)
GFR calc non Af Amer: >60 mL/min (ref >60)
Glucose, Bld: 80 mg/dL (ref 70–99)
Potassium: 3.9 mmol/L (ref 3.5–5.1)
Sodium: 138 mmol/L (ref 135–145)

## 2018-11-27 LAB — BRAIN NATRIURETIC PEPTIDE: B Natriuretic Peptide: 63.5 pg/mL (ref 0.0–100.0)

## 2018-11-27 MED ORDER — ALBUTEROL SULFATE HFA 108 (90 BASE) MCG/ACT IN AERS: 1.0000 | INHALATION_SPRAY | Freq: Four times a day (QID) | RESPIRATORY_TRACT | 0 refills | Status: DC | PRN

## 2018-11-27 MED ORDER — HYDROXYZINE HCL 25 MG PO TABS: 25.0000 mg | ORAL_TABLET | Freq: Three times a day (TID) | ORAL | 0 refills | Status: DC | PRN

## 2018-11-27 MED ORDER — CYCLOBENZAPRINE HCL 10 MG PO TABS: 10.0000 mg | ORAL_TABLET | Freq: Every evening | ORAL | 0 refills | Status: DC | PRN
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
## 2018-11-27 NOTE — ED Triage Notes (Signed)
Pt sts pain in right side abd and back, pt sts right knee pain; pt sts vaginal discharge and nasal congestion

## 2018-11-28 ENCOUNTER — Telehealth (HOSPITAL_COMMUNITY): Payer: Self-pay | Admitting: Emergency Medicine

## 2018-11-28 ENCOUNTER — Encounter (HOSPITAL_COMMUNITY): Payer: Self-pay

## 2018-11-28 LAB — VITAMIN D 25 HYDROXY (VIT D DEFICIENCY, FRACTURES): Vit D, 25-Hydroxy: 10 ng/mL — ABNORMAL LOW (ref 30.0–100.0)

## 2018-11-28 MED ORDER — VITAMIN D (ERGOCALCIFEROL) 1.25 MG (50000 UNIT) PO CAPS: 50000.0000 [IU] | ORAL_CAPSULE | ORAL | 0 refills | Status: DC

## 2018-11-28 NOTE — Telephone Encounter (Signed)
Vitamin D low, per Dr. Lanny Cramp, send prescritption for Vit D x6 weeks and after take 1000units per day. Attempted to reach patient. No answer at this time. Voicemail left.

## 2018-11-29 ENCOUNTER — Telehealth (HOSPITAL_COMMUNITY): Payer: Self-pay | Admitting: Emergency Medicine

## 2018-11-29 LAB — CERVICOVAGINAL ANCILLARY ONLY
Chlamydia: NEGATIVE
Neisseria Gonorrhea: NEGATIVE
Trichomonas: NEGATIVE

## 2018-11-29 NOTE — Telephone Encounter (Signed)
Patient contacted and made aware of  Lab  results, all questions answered   

## 2018-11-30 ENCOUNTER — Telehealth (HOSPITAL_COMMUNITY): Payer: Self-pay | Admitting: Emergency Medicine

## 2018-11-30 NOTE — Telephone Encounter (Signed)
Called and made appt with ENT sept 9th 9am.

## 2018-11-30 NOTE — Telephone Encounter (Signed)
Pt called asking for follow up info for ENT. [facility] information given.

## 2019-04-10 ENCOUNTER — Ambulatory Visit (INDEPENDENT_AMBULATORY_CARE_PROVIDER_SITE_OTHER): Payer: Medicaid Other | Admitting: Internal Medicine

## 2019-04-10 DIAGNOSIS — R222 Localized swelling, mass and lump, trunk: Secondary | ICD-10-CM | POA: Diagnosis not present

## 2019-04-10 NOTE — Progress Notes (Signed)
Virtual Visit via Telephone Note Due to current restrictions/limitations of in-office visits due to the COVID-19 pandemic, this scheduled clinical appointment was converted to a telehealth visit  I connected with Amauria L Crunk on 04/10/19 at 10:57 a.m by telephone and verified that I am speaking with the correct person using two identifiers. I am in my office.  The patient is at home.  Only the patient and myself participated in this encounter.  I discussed the limitations, risks, security and privacy concerns of performing an evaluation and management service by telephone and the availability of in person appointments. I also discussed with the patient that there may be a patient responsible charge related to this service. The patient expressed understanding and agreed to proceed.  HPI: Previous PCP was at [facility]. Last seen prior to COVID.  Her PCP left the practice so she decided to establish care here at [facility].  She tells me that she was being treated for arthritis in her back with ibuprofen and hydrocodone and was also on phentermine for obesity.  She has not been on these medications for some time.  Main concern today is for a  lump on what sounds like the RT lateral thorax below the arm pit.  First noticed it in 2014/15 Reports having had CT, and  MRI in the past to eval this and was told that nothing was found.  She had cholecystectomy in 2014.  The surgeon who did the procedure told her that it is a lipoma and that he can remove it in the future if she has any issues with it.  Currently is about the size of a quarter and it has grown over the yrs.     Outpatient Encounter Medications as of 04/10/2019  Medication Sig  . [DISCONTINUED] albuterol (VENTOLIN HFA) 108 (90 Base) MCG/ACT inhaler Inhale 1-2 puffs into the lungs every 6 (six) hours as needed for wheezing or shortness of breath.  . [DISCONTINUED] Cetirizine HCl 10 MG CAPS Take 1 capsule (10 mg total) by mouth daily for  10 days.  . [DISCONTINUED] cyclobenzaprine (FLEXERIL) 10 MG tablet Take 1 tablet (10 mg total) by mouth at bedtime as needed for muscle spasms.  . [DISCONTINUED] hydrOXYzine (ATARAX/VISTARIL) 25 MG tablet Take 1 tablet (25 mg total) by mouth every 8 (eight) hours as needed.  . [DISCONTINUED] naproxen (NAPROSYN) 500 MG tablet Take 1 tablet (500 mg total) by mouth 2 (two) times daily.  . [DISCONTINUED] olopatadine (PATANOL) 0.1 % ophthalmic solution Place 1 drop into the left eye 2 (two) times daily.  . [DISCONTINUED] Vitamin D, Ergocalciferol, (DRISDOL) 1.25 MG (50000 UT) CAPS capsule Take 1 capsule (50,000 Units total) by mouth every 7 (seven) days.   No facility-administered encounter medications on file as of 04/10/2019.    Observations/Objective: No direct observation done as this was a telephone encounter  Assessment and Plan: 1. Localized swelling, mass and lump, trunk -We will have her scheduled for an in person visit for [place] to take a look at this.  If it is a lipoma, we can refer her to a general surgeon to have it removed  Follow Up Instructions: Next available in-person appt   I discussed the assessment and treatment plan with the patient. The patient was provided an opportunity to ask questions and all were answered. The patient agreed with the plan and demonstrated an understanding of the instructions.   The patient was advised to call back or seek an in-person evaluation if the symptoms  worsen or if the condition fails to improve as anticipated.  I provided 14 minutes of non-face-to-face time during this encounter.   Karle Plumber, MD

## 2020-02-06 ENCOUNTER — Emergency Department (HOSPITAL_COMMUNITY): Payer: Medicaid Other

## 2020-02-06 ENCOUNTER — Other Ambulatory Visit: Payer: Self-pay

## 2020-02-06 ENCOUNTER — Ambulatory Visit (HOSPITAL_COMMUNITY)
Admission: EM | Admit: 2020-02-06 | Discharge: 2020-02-06 | Disposition: A | Discharge status: Home / Self Care | Payer: Medicaid Other | Attending: Physician Assistant | Admitting: Physician Assistant

## 2020-02-06 ENCOUNTER — Emergency Department (HOSPITAL_COMMUNITY)
Admission: EM | Admit: 2020-02-06 | Discharge: 2020-02-06 | Disposition: A | Discharge status: Home / Self Care | Payer: Medicaid Other | Attending: Emergency Medicine | Admitting: Emergency Medicine

## 2020-02-06 DIAGNOSIS — R079 Chest pain, unspecified: Secondary | ICD-10-CM | POA: Diagnosis not present

## 2020-02-06 DIAGNOSIS — F17210 Nicotine dependence, cigarettes, uncomplicated: Secondary | ICD-10-CM | POA: Insufficient documentation

## 2020-02-06 DIAGNOSIS — R0789 Other chest pain: Secondary | ICD-10-CM | POA: Diagnosis not present

## 2020-02-06 LAB — CBC
HCT: 42.4 % (ref 36.0–46.0)
Hemoglobin: 13.6 g/dL (ref 12.0–15.0)
MCH: 28.5 pg (ref 26.0–34.0)
MCHC: 32.1 g/dL (ref 30.0–36.0)
MCV: 88.9 fL (ref 80.0–100.0)
Platelets: 294 10*3/uL (ref 150–400)
RBC: 4.77 MIL/uL (ref 3.87–5.11)
RDW: 13.3 % (ref 11.5–15.5)
WBC: 6.2 10*3/uL (ref 4.0–10.5)
nRBC: 0 % (ref 0.0–0.2)

## 2020-02-06 LAB — BASIC METABOLIC PANEL
Anion gap: 10 (ref 5–15)
BUN: 9 mg/dL (ref 6–20)
CO2: 23 mmol/L (ref 22–32)
Calcium: 9.3 mg/dL (ref 8.9–10.3)
Chloride: 106 mmol/L (ref 98–111)
Creatinine, Ser: 0.77 mg/dL (ref 0.44–1.00)
GFR, Estimated: >60 mL/min (ref >60)
Glucose, Bld: 103 mg/dL — ABNORMAL HIGH (ref 70–99)
Potassium: 4 mmol/L (ref 3.5–5.1)
Sodium: 139 mmol/L (ref 135–145)

## 2020-02-06 LAB — TROPONIN I (HIGH SENSITIVITY)
Troponin I (High Sensitivity): 4 ng/L (ref <18)
Troponin I (High Sensitivity): 3 ng/L (ref <18)

## 2020-02-06 LAB — I-STAT BETA HCG BLOOD, ED (MC, WL, AP ONLY): I-stat hCG, quantitative: <5 m[IU]/mL (ref <5)

## 2020-02-06 MED ORDER — MELOXICAM 7.5 MG PO TABS: 7.5000 mg | ORAL_TABLET | Freq: Every day | ORAL | 0 refills | Status: DC

## 2020-02-06 MED ORDER — OXYCODONE HCL 5 MG PO TABS: 5.0000 mg | ORAL_TABLET | Freq: Once | ORAL | Status: DC

## 2020-02-06 NOTE — Discharge Instructions (Signed)
Please read and follow all provided instructions.  Your diagnoses today include:  1. Chest wall pain     Tests performed today include:  An EKG of your heart  A chest x-ray  Cardiac enzymes - a blood test for heart muscle damage, both were normal, no signs of stress on the heart  Blood counts and electrolytes  Vital signs. See below for your results today.   Medications prescribed:   Meloxicam - anti-inflammatory pain medication  You have been prescribed an anti-inflammatory medication or NSAID. Take with food. Do not take aspirin, ibuprofen, or naproxen if taking this medication. Take smallest effective dose for the shortest duration needed for your pain. Stop taking if you experience stomach pain or vomiting.   Take any prescribed medications only as directed.  Follow-up instructions: Please follow-up with your primary care provider as soon as you can for further evaluation of your symptoms.   Return instructions:  SEEK IMMEDIATE MEDICAL ATTENTION IF:  You have severe chest pain, especially if the pain is crushing or pressure-like and spreads to the arms, back, neck, or jaw, or if you have sweating, nausea (feeling sick to your stomach), or shortness of breath. THIS IS AN EMERGENCY. Don't wait to see if the pain will go away. Get medical help at once. Call 911 or 0 (operator). DO NOT drive yourself to the hospital.   Your chest pain gets worse and does not go away with rest.   You have an attack of chest pain lasting longer than usual, despite rest and treatment with the medications your caregiver has prescribed.   You wake from sleep with chest pain or shortness of breath.  You feel dizzy or faint.  You have chest pain not typical of your usual pain for which you originally saw your caregiver.   You have any other emergent concerns regarding your health.  Additional Information: Chest pain comes from many different causes. Your caregiver has diagnosed you as having  chest pain that is not specific for one problem, but does not require admission.  You are at low risk for an acute heart condition or other serious illness.   Your vital signs today were: BP 111/75    Pulse (!) 59    Temp 98.2 F (36.8 C) (Oral)    Resp 17    Ht 5\' 2"  (1.575 m)    Wt 117.9 kg    LMP 01/30/2020 (Exact Date)    SpO2 100%    BMI 47.55 kg/m  If your blood pressure (BP) was elevated above 135/85 this visit, please have this repeated by your doctor within one month. --------------

## 2020-02-06 NOTE — ED Provider Notes (Signed)
[facility] EMERGENCY DEPARTMENT Provider Note   CSN: [id number] Arrival date & time: 02/06/20  1000     History Chief Complaint  Patient presents with  . Chest Pain    Gabrella L Donaldson is a 39 y.o. female.  Patient with history of tobacco abuse, strong family history of coronary artery disease --presents emergency department today for evaluation of mid chest pain.  Patient was in her normal state of health yesterday and exercise, walking 2 miles.  She did not have any symptoms during this.  She states that she does do movements with her upper body during exercise.  This morning around 4 PM she woke up with a tight feeling in her chest that is worse with palpation of the chest wall and movement.  She denies associated shortness of breath.  She might of had some sweating at the onset.  No vomiting or exertional symptoms.  Patient does have some pain radiates to her right shoulder.  No treatments prior to arrival.  She does endorse weight gain that she attributes to stress and depression.  She has been trying to lose weight by exercising and eating more healthy.   In addition, she has pain and a "lump" to the right lateral chest wall.  This has been intermittent for years.  It has been more painful over the past 5 days.   No fevers, nausea, vomiting, diarrhea, cough. Patient denies risk factors for pulmonary embolism including: unilateral leg swelling, history of DVT/PE/other blood clots, use of exogenous hormones, recent immobilizations, recent surgery, recent travel (>4hr segment), malignancy, hemoptysis.          Past Medical History:  Diagnosis Date  . Chlamydia   . Chronic back pain   . Chronic neck pain   . Complication of anesthesia    perceived problem with epidural  . Depression   . Drug abuse (HCC)    marijuana  . Gall stones   . GERD (gastroesophageal reflux disease)   . Hypercholesterolemia   . Morbid obesity (HCC)   . Smoker     Patient Active  Problem List   Diagnosis Date Noted  . Thoracic spine pain 08/24/2013  . S/P laparoscopic cholecystectomy August 2014 11/20/2012  . Chronic back pain 01/04/2011  . Drug abuse (HCC) 01/04/2011  . History of chlamydia 01/04/2011  . Active smoker 01/04/2011  . Morbid obesity (HCC) 01/04/2011    Past Surgical History:  Procedure Laterality Date  . BTL    . CHOLECYSTECTOMY N/A 11/20/2012   Procedure: LAPAROSCOPIC CHOLECYSTECTOMY WITH INTRAOPERATIVE CHOLANGIOGRAM;  Surgeon: Valarie Merino, MD;  Location: [facility] ORS;  Service: General;  Laterality: N/A;  . RECTAL EXAM UNDER ANESTHESIA  11/20/2012   Procedure: RECTAL EXAM UNDER ANESTHESIA;  Surgeon: Valarie Merino, MD;  Location: [facility]S;  Service: General;;  . TUBAL LIGATION     had a baby since BTL     OB History    Gravida  5   Para  5   Term  5   Preterm      AB      Living  5     SAB      TAB      Ectopic      Multiple      Live Births              Family History  Problem Relation Age of Onset  . Edema Mother   . Arthritis Mother   . Emphysema Mother   .  Heart failure Father   . Pancreatic cancer Other        Mat. [employer]  . Cancer Brother        Stage 1  . Heart disease Brother        heart failure  . Breast cancer Maternal Aunt        3 aunts  . Stomach cancer Maternal Grandmother     Social History   Tobacco Use  . Smoking status: Current Every Day Smoker    Packs/day: 0.25    Years: 7.00    Pack years: 1.75    Types: Cigarettes  . Smokeless tobacco: Never Used  Vaping Use  . Vaping Use: Never used  Substance Use Topics  . Alcohol use: Yes    Comment: occasional  . Drug use: Never    Comment: denies    Home Medications Prior to Admission medications   Not on File    Allergies    Patient has no known allergies.  Review of Systems   Review of Systems  Constitutional: Negative for diaphoresis and fever.  Eyes: Negative for redness.  Respiratory: Negative for cough  and shortness of breath.   Cardiovascular: Positive for chest pain. Negative for palpitations and leg swelling.  Gastrointestinal: Negative for abdominal pain, nausea and vomiting.  Genitourinary: Negative for dysuria.  Musculoskeletal: Positive for back pain and myalgias. Negative for neck pain.  Skin: Negative for rash.  Neurological: Negative for syncope and light-headedness.  Psychiatric/Behavioral: The patient is not nervous/anxious.     Physical Exam Updated Vital Signs BP 124/69   Pulse 70   Temp 98.2 F (36.8 C) (Oral)   Resp (!) 23   Ht 5\' 2"  (1.575 m)   Wt 117.9 kg   LMP 01/30/2020 (Exact Date)   SpO2 99%   BMI 47.55 kg/m   Physical Exam Vitals and nursing note reviewed.  Constitutional:      General: She is not in acute distress.    Appearance: She is well-developed.  HENT:     Head: Normocephalic and atraumatic.     Right Ear: External ear normal.     Left Ear: External ear normal.     Nose: Nose normal.  Eyes:     Conjunctiva/sclera: Conjunctivae normal.  Cardiovascular:     Rate and Rhythm: Normal rate and regular rhythm.     Heart sounds: No murmur heard.   Pulmonary:     Effort: No respiratory distress.     Breath sounds: No wheezing, rhonchi or rales.  Chest:     Chest wall: Tenderness present.     Comments: Patient pulls away winces when I press over her sternal area.  She states that this reproduces her pain.  Right lateral chest: No obvious lumps, abscesses or overlying cellulitis.  Patient winces with palpation of this area, but it takes sometime for her to localize the area with her fingers.  Abdominal:     Palpations: Abdomen is soft.     Tenderness: There is no abdominal tenderness. There is no guarding or rebound.  Musculoskeletal:     Cervical back: Normal range of motion and neck supple.     Right lower leg: No edema.     Left lower leg: No edema.  Skin:    General: Skin is warm and dry.     Findings: No rash.  Neurological:      General: No focal deficit present.     Mental Status: She is alert. Mental status  is at baseline.     Motor: No weakness.  Psychiatric:        Mood and Affect: Mood normal.     ED Results / Procedures / Treatments   Labs (all labs ordered are listed, but only abnormal results are displayed) Labs Reviewed  BASIC METABOLIC PANEL - Abnormal; Notable for the following components:      Result Value   Glucose, Bld 103 (*)    All other components within normal limits  CBC  I-STAT BETA HCG BLOOD, ED ([facility], [facility], [facility] ONLY)  TROPONIN I (HIGH SENSITIVITY)  TROPONIN I (HIGH SENSITIVITY)    ED ECG REPORT   Date: 02/06/2020  Rate: 69  Rhythm: normal sinus rhythm  QRS Axis: normal  Intervals: normal  ST/T Wave abnormalities: nonspecific T wave changes  Conduction Disutrbances:none  Narrative Interpretation:   Old EKG Reviewed: unchanged, t-wave abnormality present 11/05/16  I have personally reviewed the EKG tracing.   Radiology DG Chest 2 View  Result Date: 02/06/2020 CLINICAL DATA:  Chest pain EXAM: CHEST - 2 VIEW COMPARISON:  11/05/2016 FINDINGS: The heart size and mediastinal contours are within normal limits. Both lungs are clear. The visualized skeletal structures are unremarkable. IMPRESSION: No active cardiopulmonary disease. Electronically Signed   By: Duanne Guess D.O.   On: 02/06/2020 10:35    Procedures Procedures (including critical care time)  Medications Ordered in ED Medications  oxyCODONE (Oxy IR/ROXICODONE) immediate release tablet 5 mg (has no administration in time range)    ED Course  I have reviewed the triage vital signs and the nursing notes.  Pertinent labs & imaging results that were available during my care of the patient were reviewed by me and considered in my medical decision making (see chart for details).  Patient seen and examined. Work-up initiated.  EKG reviewed.    Vital signs reviewed and are as follows: BP 124/69   Pulse 70   Temp 98.2  F (36.8 C) (Oral)   Resp (!) 23   Ht 5\' 2"  (1.575 m)   Wt 117.9 kg   LMP 01/30/2020 (Exact Date)   SpO2 99%   BMI 47.55 kg/m   1:26 PM patient rechecked, updated on results to this point.  States that she is agitated and anxious to go home so she can take something for pain.  I offered patient pain medication.  She is not driving.  She accepts; 5 mg oxycodone ordered.  Awaiting second troponin.  Anticipate discharge to home with treatment for chest wall pain at this visit.  Patient verbalizes understanding agrees with plan.  1:51 PM 2nd troponin neg. Will discharge.   Patient was counseled to return with severe chest pain, especially if the pain is crushing or pressure-like and spreads to the arms, back, neck, or jaw, or if they have sweating, nausea, or shortness of breath with the pain. They were encouraged to call 911 with these symptoms.   The patient verbalized understanding and agreed.       MDM Rules/Calculators/A&P                          Pt with reproducible chest wall pain.  Cardiac work-up undertaken given risk factors.  Troponin negative x2.  EKG nonischemic.  Chest x-ray is clear.  Manage above is unremarkable.  No concern for ACS, pneumonia, pneumothorax, dissection, or other worsening etiology at this time.  Plan for discharged home with anti-inflammatories, PCP follow-up.  Reported chronic vomiting  right lateral chest wall, which PCP follow-up.  No signs of abscess or cellulitis today.    Final Clinical Impression(s) / ED Diagnoses Final diagnoses:  Chest wall pain    Rx / DC Orders ED Discharge Orders         Ordered    meloxicam (MOBIC) 7.5 MG tablet  Daily        02/06/20 1350           Renne CriglerGeiple, Yavonne Kiss, PA-C 02/06/20 1353    Sabas SousBero, Michael M, MD 02/06/20 1644

## 2020-02-06 NOTE — ED Notes (Signed)
Pt verbalized understanding of discharge paperwork, prescription and follow-up care.  

## 2020-02-06 NOTE — ED Triage Notes (Signed)
Pt c/o mid sternal CP onset at 0400 this morning. Pt states CP woke her up and she has accompanying SOB, pain radiating through to back. Describes as "very tight" in nature.   Non re-producible on palpation or movement.  Pt reports h/o father and brother both dying of AMI at age 39.  Denies n/v, diaphoresis, pain to jaw or shoulder, dizziness.  EKG performed and given to Dr. Tracie Harrier who advised pt to go to ED STAT for higher level eval of cardia symptoms. Pt given option to travel via EMS, pt declined and opted to go via POV.  Patient is being discharged from the Urgent Care and sent to the Emergency Department via POV . Per Dr. Tracie Harrier, patient is in need of higher level of care due to CP, SOB Patient is aware and verbalizes understanding of plan of care.  Vitals:   02/06/20 0943  BP: (!) 128/94  Pulse: 62  Resp: (!) 22  Temp: 99.1 F (37.3 C)  SpO2: 100%

## 2020-02-06 NOTE — ED Triage Notes (Signed)
Pt here from [facility] for further eval of central chest tightness that woke her out of sleep this morning at 0400. Endorses shortness of breath and a "lump" in her right chest.

## 2020-02-07 ENCOUNTER — Telehealth: Payer: Self-pay

## 2020-02-07 NOTE — Telephone Encounter (Signed)
Transition Care Management Follow-up Telephone Call  Date of discharge and from where: 02/06/20 [facility]  How have you been since you were released from the hospital? Doing okay  Any questions or concerns? No  Items Reviewed:  Did the pt receive and understand the discharge instructions provided? Yes   Medications obtained and verified? Yes   Other? No   Any new allergies since your discharge? No   Dietary orders reviewed? Yes  Do you have support at home? Yes   Home Care and Equipment/Supplies: Were home health services ordered? not applicable If so, what is the name of the agency? Not applicable  Has the agency set up a time to come to the patient's home? not applicable Were any new equipment or medical supplies ordered?  No What is the name of the medical supply agency? Not applicable Were you able to get the supplies/equipment? not applicable Do you have any questions related to the use of the equipment or supplies? No  Functional Questionnaire: (I = Independent and D = Dependent) ADLs: I  Bathing/Dressing- I  Meal Prep- I  Eating- I  Maintaining continence- I  Transferring/Ambulation- I  Managing Meds- I  Follow up appointments reviewed:   PCP Hospital f/u appt confirmed? No  Specialist Hospital f/u appt confirmed? No    Are transportation arrangements needed? No   If their condition worsens, is the pt aware to call PCP or go to the Emergency Dept.? Yes  Was the patient provided with contact information for the PCP's office or ED? Yes  Was to pt encouraged to call back with questions or concerns? Yes    Patient denied appointment with PCP to be made on her behave,advised patient to contact PCP Lerry Liner, MD for follow up.

## 2020-05-30 ENCOUNTER — Other Ambulatory Visit: Payer: Self-pay | Admitting: *Deleted

## 2020-05-30 DIAGNOSIS — R5381 Other malaise: Secondary | ICD-10-CM

## 2020-07-09 ENCOUNTER — Other Ambulatory Visit: Payer: Self-pay

## 2020-07-09 ENCOUNTER — Encounter (HOSPITAL_COMMUNITY): Payer: Self-pay

## 2020-07-09 ENCOUNTER — Ambulatory Visit (HOSPITAL_COMMUNITY)
Admission: EM | Admit: 2020-07-09 | Discharge: 2020-07-09 | Disposition: A | Discharge status: Home / Self Care | Payer: Medicaid Other | Attending: Physician Assistant | Admitting: Physician Assistant

## 2020-07-09 DIAGNOSIS — M79604 Pain in right leg: Secondary | ICD-10-CM

## 2020-07-09 DIAGNOSIS — N766 Ulceration of vulva: Secondary | ICD-10-CM | POA: Diagnosis not present

## 2020-07-09 DIAGNOSIS — Z8619 Personal history of other infectious and parasitic diseases: Secondary | ICD-10-CM | POA: Diagnosis not present

## 2020-07-09 DIAGNOSIS — J0140 Acute pansinusitis, unspecified: Secondary | ICD-10-CM | POA: Diagnosis not present

## 2020-07-09 MED ORDER — VALACYCLOVIR HCL 1 G PO TABS: 1000.0000 mg | ORAL_TABLET | Freq: Two times a day (BID) | ORAL | 0 refills | Status: AC

## 2020-07-09 MED ORDER — AMOXICILLIN-POT CLAVULANATE 875-125 MG PO TABS: 1.0000 | ORAL_TABLET | Freq: Two times a day (BID) | ORAL | 0 refills | Status: DC

## 2020-07-09 NOTE — Discharge Instructions (Signed)
Go get your ultrasound tomorrow.  If you have any worsening symptoms overnight including chest pain or shortness of breath need to go to the hospital.  Augmentin is an antibiotic that will treat your sinus infection.  Valtrex will treat herpes.  If you have any worsening symptoms please let us know.  Please follow-up with your PCP as we discussed.

## 2020-07-09 NOTE — ED Triage Notes (Signed)
Pt in with c/o right leg pain and right side pain that has been going on for the last few days  States she was told that she may have a lipoma that needs to be removed but she feels inflammation on her right side  Pt also c/o coughing up mucus and a bump on her vagina that she would like checked out  Pt has been taking advil with no relief

## 2020-07-09 NOTE — ED Provider Notes (Signed)
[facility]    CSN: [id number] Arrival date & time: 07/09/20  1723      History   Chief Complaint Chief Complaint  Patient presents with  . Leg Pain  . right side pain  . Bump on vagina    HPI Vanessa Donaldson is a 40 y.o. female.   Patient presents today to address several complaints.  She has not seen her primary care recently as her previous PCP left and she is on establish with someone new.  She reports several months ago she hurt her knee and was told that this is due to arthritis.  She has had intermittent pain since that time that she believes this is related to a soft tissue injury as she felt a pop and had ongoing pain.  She was able to manage this and then pain suddenly worsened 3 days ago.  It now involves her entire right thigh and is described as aching.  Pain is rated 8 on a 0-10 pain scale, localized to right thigh without radiation, described as aching/throbbing, worse with ambulation or palpation, no alleviating factors identified.  She has tried Advil without improvement of symptoms.  She denies history of DVT, history of malignancy, exogenous hormone use, recent travel, immobilization, hospitalization.  She denies any chest pain or shortness of breath.  In addition, patient reports a several week history of sinus symptoms.  She has a history of allergies but states current symptoms are worse than typical allergy symptoms.  Reports congestion, drainage, cough.  Denies any shortness of breath increased from baseline, chest pain, headache, dizziness, nausea, vomiting.  She has tried over-the-counter allergy medicine without improvement in symptoms.  Denies any known sick contacts.  She has had flu and Covid vaccines.  In addition, patient reports several day history of painful lesion on interest of vagina.  She has a history of genital herpes but is not taking any suppressive medication.  Reports she is only had outbreaks treated in the past.  She reports symptoms  occur generally once a month prior to her menstrual cycle.  Reports associated pain.  Denies any vaginal bleeding, discharge.   In addition, patient is requesting we place an order for mammogram.  She has a painful cyst in her right lateral rib cage.  She is previously been told this is a lipoma but has not had this managed.  She has a strong family history of breast cancer in her family members and is due for mammogram.  She denies any breast lump, nipple discharge, skin changes.  She was encouraged to follow-up with PCP as soon as possible to have mammogram order placed.     Past Medical History:  Diagnosis Date  . Chlamydia   . Chronic back pain   . Chronic neck pain   . Complication of anesthesia    perceived problem with epidural  . Depression   . Drug abuse (HCC)    marijuana  . Gall stones   . GERD (gastroesophageal reflux disease)   . Hypercholesterolemia   . Morbid obesity (HCC)   . Smoker     Patient Active Problem List   Diagnosis Date Noted  . Thoracic spine pain 08/24/2013  . S/P laparoscopic cholecystectomy August 2014 11/20/2012  . Chronic back pain 01/04/2011  . Drug abuse (HCC) 01/04/2011  . History of chlamydia 01/04/2011  . Active smoker 01/04/2011  . Morbid obesity (HCC) 01/04/2011    Past Surgical History:  Procedure Laterality Date  . BTL    .  CHOLECYSTECTOMY N/A 11/20/2012   Procedure: LAPAROSCOPIC CHOLECYSTECTOMY WITH INTRAOPERATIVE CHOLANGIOGRAM;  Surgeon: Valarie MerinoMatthew B Martin, MD;  Location: [facility];  Service: General;  Laterality: N/A;  . RECTAL EXAM UNDER ANESTHESIA  11/20/2012   Procedure: RECTAL EXAM UNDER ANESTHESIA;  Surgeon: Valarie MerinoMatthew B Martin, MD;  Location: [facility];  Service: General;;  . TUBAL LIGATION     had a baby since BTL    OB History    Gravida  5   Para  5   Term  5   Preterm      AB      Living  5     SAB      IAB      Ectopic      Multiple      Live Births               Home Medications    Prior to  Admission medications   Medication Sig Start Date End Date Taking? Authorizing Provider  amoxicillin-clavulanate (AUGMENTIN) 875-125 MG tablet Take 1 tablet by mouth every 12 (twelve) hours. 07/09/20  Yes Latania Bascomb, Noberto RetortErin K, PA-C  valACYclovir (VALTREX) 1000 MG tablet Take 1 tablet (1,000 mg total) by mouth 2 (two) times daily for 7 days. 07/09/20 07/16/20 Yes Lino Wickliff, Noberto RetortErin K, PA-C  meloxicam (MOBIC) 7.5 MG tablet Take 1 tablet (7.5 mg total) by mouth daily. 02/06/20   Renne CriglerGeiple, Joshua, PA-C    Family History Family History  Problem Relation Age of Onset  . Edema Mother   . Arthritis Mother   . Emphysema Mother   . Heart failure Father   . Pancreatic cancer Other        Mat. [employer] Grandmother  . Cancer Brother        Stage 1  . Heart disease Brother        heart failure  . Breast cancer Maternal Aunt        3 aunts  . Stomach cancer Maternal Grandmother     Social History Social History   Tobacco Use  . Smoking status: Current Every Day Smoker    Packs/day: 0.25    Years: 7.00    Pack years: 1.75    Types: Cigarettes  . Smokeless tobacco: Never Used  Vaping Use  . Vaping Use: Never used  Substance Use Topics  . Alcohol use: Yes    Comment: occasional  . Drug use: Never    Comment: denies     Allergies   Patient has no known allergies.   Review of Systems Review of Systems  Constitutional: Positive for activity change. Negative for appetite change, fatigue and fever.  HENT: Positive for congestion and sinus pressure. Negative for sneezing and sore throat.   Respiratory: Positive for cough. Negative for shortness of breath.   Cardiovascular: Negative for chest pain.  Gastrointestinal: Negative for abdominal pain, diarrhea, nausea and vomiting.  Genitourinary: Positive for genital sores. Negative for vaginal bleeding, vaginal discharge and vaginal pain.  Musculoskeletal: Positive for myalgias (right leg). Negative for arthralgias.  Neurological: Negative for dizziness,  light-headedness and headaches.     Physical Exam Triage Vital Signs ED Triage Vitals  Enc Vitals Group     BP 07/09/20 1806 113/78     Pulse Rate 07/09/20 1806 78     Resp 07/09/20 1806 20     Temp 07/09/20 1806 98.1 F (36.7 C)     Temp src --      SpO2 07/09/20 1806 98 %  Weight --      Height --      Head Circumference --      Peak Flow --      Pain Score 07/09/20 1804 9     Pain Loc --      Pain Edu? --      Excl. in GC? --    No data found.  Updated Vital Signs BP 113/78   Pulse 78   Temp 98.1 F (36.7 C)   Resp 20   SpO2 98%   Visual Acuity Right Eye Distance:   Left Eye Distance:   Bilateral Distance:    Right Eye Near:   Left Eye Near:    Bilateral Near:     Physical Exam Vitals reviewed. Exam conducted with a chaperone present.  Constitutional:      General: She is awake. She is not in acute distress.    Appearance: Normal appearance. She is not ill-appearing.     Comments: Very pleasant female appears stated age in no acute distress  HENT:     Head: Normocephalic and atraumatic.     Right Ear: Tympanic membrane, ear canal and external ear normal. Tympanic membrane is not erythematous or bulging.     Left Ear: Tympanic membrane, ear canal and external ear normal. Tympanic membrane is not erythematous or bulging.     Nose:     Right Sinus: Maxillary sinus tenderness present. No frontal sinus tenderness.     Left Sinus: Maxillary sinus tenderness present. No frontal sinus tenderness.     Mouth/Throat:     Pharynx: Uvula midline. Posterior oropharyngeal erythema present. No oropharyngeal exudate.     Comments: Erythema and drainage present posterior oropharynx Cardiovascular:     Rate and Rhythm: Normal rate and regular rhythm.     Heart sounds: No murmur heard.   Pulmonary:     Effort: Pulmonary effort is normal.     Breath sounds: Normal breath sounds. No wheezing, rhonchi or rales.     Comments: Clear to auscultation  bilaterally Abdominal:     General: Bowel sounds are normal.     Palpations: Abdomen is soft.     Tenderness: There is no abdominal tenderness.  Genitourinary:    Labia:        Right: No rash or tenderness.        Left: No rash or tenderness.      Vagina: Lesions present. No vaginal discharge.     Comments: Small ulcerated lesion noted vaginal introitus.  Chaperone present during exam. Musculoskeletal:     Right upper leg: Tenderness present. No swelling, edema, deformity or bony tenderness.     Right lower leg: No edema.     Left lower leg: No edema.     Comments: Right leg: No significant edema or erythema.  Tender to palpation of anterior and lateral right leg.  No deformity noted.  Lymphadenopathy:     Head:     Right side of head: No submental, submandibular or tonsillar adenopathy.     Left side of head: No submental, submandibular or tonsillar adenopathy.     Cervical: No cervical adenopathy.  Psychiatric:        Behavior: Behavior is cooperative.      UC Treatments / Results  Labs (all labs ordered are listed, but only abnormal results are displayed) Labs Reviewed - No data to display  EKG   Radiology No results found.  Procedures Procedures (including critical care time)  Medications Ordered  in UC Medications - No data to display  Initial Impression / Assessment and Plan / UC Course  I have reviewed the triage vital signs and the nursing notes.  Pertinent labs & imaging results that were available during my care of the patient were reviewed by me and considered in my medical decision making (see chart for details).     Patient was encouraged to use over-the-counter analgesics for pain relief.  Will obtain ultrasound to rule out DVT.  Unfortunately, it is after hours and patient is unable to get ultrasound today so we discussed that if she has any worsening symptoms overnight she is to go to the emergency room.  Strict return precautions given to which  patient expressed understanding.  Patient treated with Augmentin for suspected sinus infection given prolonged and worsening symptoms.  She is to use over-the-counter medications for additional symptom relief.  Return precautions given to which patient expressed understanding.  Patient was treated for herpes given ulcerative lesion on exam.  She was instructed to return with any persistent or worsening symptoms.  Discussed that we are unable to order mammograms and so we will use PCP assistance to ensure patient finds a new PCP soon to have mammogram order placed.  Discussed that if she develops any breast mass, nipple discharge, additional symptoms she needs to be evaluated immediately.  Final Clinical Impressions(s) / UC Diagnoses   Final diagnoses:  Right leg pain  Acute non-recurrent pansinusitis  History of herpes genitalis  Genital ulcer, female     Discharge Instructions     Go get your ultrasound tomorrow.  If you have any worsening symptoms overnight including chest pain or shortness of breath need to go to the hospital.  Augmentin is an antibiotic that will treat your sinus infection.  Valtrex will treat herpes.  If you have any worsening symptoms please let us know.  Please follow-up with your PCP as we discussed.    ED Prescriptions    Medication Sig Dispense Auth. Provider   amoxicillin-clavulanate (AUGMENTIN) 875-125 MG tablet Take 1 tablet by mouth every 12 (twelve) hours. 14 tablet Evelisse Szalkowski K, PA-C   valACYclovir (VALTREX) 1000 MG tablet Take 1 tablet (1,000 mg total) by mouth 2 (two) times daily for 7 days. 14 tablet Krithik Mapel, Noberto Retort, PA-C     PDMP not reviewed this encounter.   Jeani Hawking, PA-C 07/09/20 1847

## 2020-07-10 ENCOUNTER — Ambulatory Visit (HOSPITAL_COMMUNITY): Payer: Medicaid Other | Attending: Physician Assistant

## 2020-07-15 ENCOUNTER — Encounter: Payer: Self-pay | Admitting: *Deleted

## 2020-07-28 ENCOUNTER — Encounter: Payer: Medicaid Other | Admitting: Student

## 2020-07-29 ENCOUNTER — Encounter: Payer: Medicaid Other | Admitting: Internal Medicine

## 2020-07-29 NOTE — Progress Notes (Deleted)
   CC: Right left pain, sinus issues, and ***  HPI:  [name] is a 40 y.o. with a history of depression, marijuana use, GERD, HLD, *** presenting for right leg pain, sinus issues, and ***.   Past Medical History:  Diagnosis Date  . Chlamydia   . Chronic back pain   . Chronic neck pain   . Complication of anesthesia    perceived problem with epidural  . Depression   . Drug abuse (HCC)    marijuana  . Gall stones   . GERD (gastroesophageal reflux disease)   . Hypercholesterolemia   . Morbid obesity (HCC)   . Smoker    Review of Systems:  ***  Physical Exam:  There were no vitals filed for this visit. ***  Assessment & Plan:   See Encounters Tab for problem based charting.  Patient {GC/GE:3044014::"discussed with","seen with"} Dr. {NAMES:[id number]::"Butcher","Guilloud","Hoffman","Mullen","Narendra","Raines","Vincent"}

## 2020-08-08 ENCOUNTER — Other Ambulatory Visit: Payer: Self-pay

## 2020-08-08 ENCOUNTER — Encounter: Payer: Self-pay | Admitting: Internal Medicine

## 2020-08-08 ENCOUNTER — Ambulatory Visit: Payer: Medicaid Other | Admitting: Internal Medicine

## 2020-08-08 VITALS — BP 109/64 | HR 75 | Temp 98.4°F | Ht 62.0 in | Wt 288.5 lb

## 2020-08-08 DIAGNOSIS — M25561 Pain in right knee: Secondary | ICD-10-CM

## 2020-08-08 DIAGNOSIS — N3946 Mixed incontinence: Secondary | ICD-10-CM | POA: Diagnosis not present

## 2020-08-08 DIAGNOSIS — G8929 Other chronic pain: Secondary | ICD-10-CM | POA: Diagnosis not present

## 2020-08-08 DIAGNOSIS — L304 Erythema intertrigo: Secondary | ICD-10-CM

## 2020-08-08 DIAGNOSIS — Z6841 Body Mass Index (BMI) 40.0 and over, adult: Secondary | ICD-10-CM

## 2020-08-08 DIAGNOSIS — R0601 Orthopnea: Secondary | ICD-10-CM | POA: Diagnosis not present

## 2020-08-08 DIAGNOSIS — M7989 Other specified soft tissue disorders: Secondary | ICD-10-CM | POA: Diagnosis not present

## 2020-08-08 LAB — POCT GLYCOSYLATED HEMOGLOBIN (HGB A1C): Hemoglobin A1C: 5.4 % (ref 4.0–5.6)

## 2020-08-08 LAB — GLUCOSE, CAPILLARY: Glucose-Capillary: 94 mg/dL (ref 70–99)

## 2020-08-08 MED ORDER — CLOTRIMAZOLE 1 % EX CREA: 1.0000 "application " | TOPICAL_CREAM | Freq: Two times a day (BID) | CUTANEOUS | 0 refills | Status: DC

## 2020-08-08 NOTE — Progress Notes (Addendum)
CC: Knee pain; establish care   HPI:  [name] is a 40 y.o. with a PMHx as listed below who presents to the clinic for Knee pain; establish care .   Please see the Encounters tab for problem-based Assessment & Plan regarding status of patient's acute and chronic conditions.  Past Medical History:  Diagnosis Date  . Chlamydia   . Chronic back pain   . Chronic neck pain   . Complication of anesthesia    perceived problem with epidural  . Depression   . Drug abuse (HCC)    marijuana  . Gall stones   . GERD (gastroesophageal reflux disease)   . Hypercholesterolemia   . Morbid obesity (HCC)   . Smoker    Family History:  Cardiac Disease, Father, Brother (both passed)  Breast Cancer (aunt (3x), grandmother, younger than 82)  Grandmother: Stomach Cancer No diabetes in the family.   Social History:   Lives in [place] with daughter.  Current tobacco use daily  Denies any drug use other than occasional marijuana Social alcohol use only, up to a couple times per month   Review of Systems: Review of Systems  Constitutional: Negative for chills, fever, malaise/fatigue and weight loss.  HENT: Negative for congestion and sinus pain.   Respiratory: Positive for shortness of breath (with laying down). Negative for cough.   Cardiovascular: Negative for chest pain, palpitations and leg swelling.  Gastrointestinal: Positive for constipation and diarrhea. Negative for abdominal pain, nausea and vomiting.  Genitourinary: Positive for urgency. Negative for dysuria, flank pain, frequency and hematuria.  Musculoskeletal: Positive for joint pain (right knee).  Skin: Positive for itching and rash.  Neurological: Negative for dizziness, focal weakness, weakness and headaches.  Psychiatric/Behavioral: Negative for depression and substance abuse.   Physical Exam:  Vitals:   08/08/20 1055  BP: 109/64  Pulse: 75  Temp: 98.4 F (36.9 C)  TempSrc: Oral  SpO2: 99%  Weight: 288  lb 8 oz (130.9 kg)  Height: 5\' 2"  (1.575 m)   Physical Exam Vitals and nursing note reviewed.  Constitutional:      General: She is not in acute distress.    Appearance: She is obese.  HENT:     Head: Normocephalic and atraumatic.     Mouth/Throat:     Mouth: Mucous membranes are moist.     Pharynx: Oropharynx is clear.  Eyes:     Extraocular Movements: Extraocular movements intact.     Conjunctiva/sclera: Conjunctivae normal.     Pupils: Pupils are equal, round, and reactive to light.  Cardiovascular:     Rate and Rhythm: Normal rate and regular rhythm.     Heart sounds: No murmur heard.   Pulmonary:     Effort: Pulmonary effort is normal. No respiratory distress.     Breath sounds: Normal breath sounds. No wheezing, rhonchi or rales.  Chest:    Abdominal:     General: Bowel sounds are normal. There is no distension.     Palpations: Abdomen is soft.     Tenderness: There is no abdominal tenderness. There is no guarding.  Musculoskeletal:     Right knee: No swelling or deformity. Tenderness present over the medial joint line. No LCL laxity, MCL laxity, ACL laxity or PCL laxity.     Instability Tests: Anterior drawer test negative. Posterior drawer test negative. Anterior Lachman test negative. Medial McMurray test positive (pain with medial mcmurry test). Lateral McMurray test negative.     Right lower leg:  No edema.     Left lower leg: No edema.  Skin:    General: Skin is warm and dry.     Comments: Erythematous rash underlying left breast with clear demarcation.   Neurological:     General: No focal deficit present.     Mental Status: She is alert and oriented to person, place, and time. Mental status is at baseline.  Psychiatric:        Mood and Affect: Mood normal.        Behavior: Behavior normal.        Thought Content: Thought content normal.        Judgment: Judgment normal.    Assessment & Plan:   See Encounters Tab for problem based charting.  Patient  discussed with Dr. Antony Contras

## 2020-08-08 NOTE — Assessment & Plan Note (Addendum)
Vanessa Donaldson states that approximately 2 months ago, she was walking on flat ground when she heard a very loud pop and had sudden onset right knee pain located in the superior medial area.  She continues to walk on this knee and is able to bear weight, however has been experiencing significant buckling and occasional popping.  She notes that her right knee pain has continued to get worse.  Assessment/plan: Given loud pop and immediate pain on initial presentation with positive medial McMurray's on exam I am concerned there is a meniscal injury.  We will need to obtain an x-ray first and plan to likely require an MRI afterwards.  Recommended supportive management with Tylenol and ibuprofen.  - Right knee x-ray - Suspect an MRI will be required - Tylenol 1000 mg every 6 hours as needed - Ibuprofen up to 800 mg every 6 hours.  Counseled this may cause upset stomach

## 2020-08-08 NOTE — Patient Instructions (Addendum)
It was nice seeing you today! Thank you for choosing [facility] for your Primary Care.    Today we talked about:   1. Right knee pain: We will start work up with an xray but may end up needing an MRI. Depending on what we find, a referral to orthopedic surgeon may be necessary.  a. For pain control:  i. Take Aleve or Ibuprofen. Plus with each dose of those, take 2 tablets of Tylenol (you can take up to 1000 mg every 6 hours)  2. Right chest lump: We have ordered an ultrasound.   3. Breast rash: Apply Clotrimazole cream twice daily for up to 2 weeks.   4. I have sent a referral to a Urologist to discuss the urinary incontinence.    Let's follow up in 4-6 weeks.

## 2020-08-09 LAB — BMP8+ANION GAP
Anion Gap: 14 mmol/L (ref 10.0–18.0)
BUN/Creatinine Ratio: 15 (ref 9–23)
BUN: 10 mg/dL (ref 6–24)
CO2: 20 mmol/L (ref 20–29)
Calcium: 9.1 mg/dL (ref 8.7–10.2)
Chloride: 103 mmol/L (ref 96–106)
Creatinine, Ser: 0.67 mg/dL (ref 0.57–1.00)
Glucose: 78 mg/dL (ref 65–99)
Potassium: 4.2 mmol/L (ref 3.5–5.2)
Sodium: 137 mmol/L (ref 134–144)
eGFR: 113 mL/min/{1.73_m2} (ref >59)

## 2020-08-12 ENCOUNTER — Encounter: Payer: Self-pay | Admitting: Internal Medicine

## 2020-08-12 DIAGNOSIS — R0601 Orthopnea: Secondary | ICD-10-CM | POA: Insufficient documentation

## 2020-08-12 DIAGNOSIS — M7989 Other specified soft tissue disorders: Secondary | ICD-10-CM | POA: Insufficient documentation

## 2020-08-12 DIAGNOSIS — N3946 Mixed incontinence: Secondary | ICD-10-CM | POA: Insufficient documentation

## 2020-08-12 DIAGNOSIS — L304 Erythema intertrigo: Secondary | ICD-10-CM | POA: Insufficient documentation

## 2020-08-12 NOTE — Assessment & Plan Note (Signed)
Vanessa Donaldson endorses a several year history of urinary incontinence that she states first began when she would cough or sneeze, however she is now experiencing significant nocturia.  She states she will wake up and her bed will be wet.  Vanessa Donaldson also states that at times, she has sudden urge to urinate and if she does not make it to the restroom, she has urinary incontinence with large volume loss.  Assessment/plan: Overall mixed urge and stress incontinence complicated by nocturia.  I wonder if the nocturia portion may be overflow.  Patient would benefit from evaluation by urology and possible urodynamic testing.  -Referral to urology placed

## 2020-08-12 NOTE — Assessment & Plan Note (Signed)
Vanessa Donaldson endorses a long-term history of orthopnea and due to this sleeps sitting up.  She denies any known heart problems.  She denies any palpitations, chest pain, leg swelling.  She states that her family complains she snores very loud and she does have morning fatigue.  Assessment/plan: No evidence of hypervolemia on examination to suggest this is heart failure.  Given patient's morbid obesity with BMI of 52, I wonder if there is an aspect of obstruction.  We will send patient for sleep study.  - Sleep study ordered

## 2020-08-12 NOTE — Assessment & Plan Note (Signed)
Vanessa Donaldson endorses a chronic but very tender mass near her lateral right breast under the axillary region.  She states that the pain comes and goes and she has not found any alleviating factors.  She had seen general surgeon before who offered excision, however she was not comfortable at the time with surgery.  She would like further evaluation.  Assessment/plan: On very deep palpation, a small tender mass can be felt.  Given size and location, suspect this is an inflamed lymph node, however unusual that it would be causing her pain for this long.  We will start work-up with ultrasound.  - Soft tissue chest ultrasound ordered

## 2020-08-12 NOTE — Progress Notes (Signed)
Internal Medicine Clinic Attending  Case discussed with Dr. Basaraba  At the time of the visit.  We reviewed the resident's history and exam and pertinent patient test results.  I agree with the assessment, diagnosis, and plan of care documented in the resident's note.  

## 2020-08-12 NOTE — Assessment & Plan Note (Signed)
Vanessa Donaldson endorses a rash under her left breast that is been itchy and a little painful.  She has not tried any over-the-counter creams.  Assessment/plan: Evaluation consistent with intertrigo.  -Lotrimin cream apply twice daily for 2 weeks -Recommended she keep the area clean and dry

## 2020-08-12 NOTE — Assessment & Plan Note (Addendum)
Vanessa Donaldson states that she is interested in weight loss and would like assistance with this.  She has tried various diets and has at times been successful, however due to stressors and emotional eating, she will gain the weight back.  Assessment/plan: Patient would likely benefit from a GLP-1, however given A1c is within normal limits, unlikely to have it covered by her insurance.  We will need to explore other options.    BMP was obtained does not show any electrolyte abnormality or kidney dysfunction.  -We will continue discussing this at future visits.

## 2020-09-30 ENCOUNTER — Encounter: Payer: Self-pay | Admitting: *Deleted

## 2020-11-08 ENCOUNTER — Inpatient Hospital Stay: Admit: 2020-11-08 | Discharge: 2020-11-08 | Payer: Medicaid - Out of State

## 2020-11-08 MED ORDER — IBUPROFEN 600 MG TABLET
600 mg | Freq: Once | ORAL | Status: CP
Start: 2020-11-08 — End: ?
  Administered 2020-11-08: 16:00:00 600 mg via ORAL

## 2020-11-08 NOTE — ED Provider Notes
HistoryChief Complaint Patient presents with ? Headache- New Onset Or New Symptoms   BIBA from motel. Patient reports 6/10 head pain after a ceiling tile fell on her head. -LOC. -Thinners. Aox4. Ambulatory. No vision change. No deformity noted.   Was in shower at hotel and ceiling tile fell on her head. Has pictures, ceiling tile is typical mineral wool/fiber board type. Fell from height of about 2 ft. Now with headacheThe history is provided by the patient. No language interpreter was used. Head InjuryHead/neck injury location: top of head.Time since incident:  1 hourMechanism of injury comment:  Ceiling tile fellPain details:   Quality:  Aching  Duration:  1 hour  Timing:  Constant  Progression:  UnchangedChronicity:  NewRelieved by:  NothingWorsened by:  NothingIneffective treatments:  None triedAssociated symptoms: headache  Associated symptoms: no blurred vision, no double vision, no neck pain and no numbness   No past medical history on file.No past surgical history on file.No family history on file.Social History Socioeconomic History ? Marital status: Single No existing history information found.No existing history information found.No existing history information found.Review of Systems Eyes: Negative for blurred vision and double vision. Musculoskeletal: Negative for neck pain. Skin: Negative for wound. Neurological: Positive for headaches. Negative for weakness and numbness.  Physical ExamED Triage Vitals [11/08/20 1107]BP: 121/76Pulse: 76Pulse from  O2 sat: n/aResp: 18Temp: 98 ?F (36.7 ?C)Temp src: OralSpO2: 99 % BP 121/76  - Pulse 76  - Temp 98 ?F (36.7 ?C) (Oral)  - Resp 18  - SpO2 99% Physical ExamVitals and nursing note reviewed. Constitutional:     Appearance: Normal appearance. HENT:    Head: Normocephalic and atraumatic. Eyes:    Extraocular Movements: Extraocular movements intact.    Pupils: Pupils are equal, round, and reactive to light. Cardiovascular:    Rate and Rhythm: Normal rate and regular rhythm. Musculoskeletal:    Cervical back: No bony tenderness. Skin:   Capillary Refill: Capillary refill takes less than 2 seconds. Neurological:    General: No focal deficit present.    Mental Status: She is alert and oriented to person, place, and time.    Cranial Nerves: No cranial nerve deficit.    Sensory: No sensory deficit.    Motor: No weakness. Psychiatric:       Mood and Affect: Mood normal.       Behavior: Behavior normal.  ProceduresProcedures  Assessment & Plan Struck by ceiling tile from height of about 2 ft overhead. No fall or LOC. Headache. No laceration, or hematoma. Normal neuro exam. Neck non tender. Do not suspect ICH or cervical injury. Imaging not indicated. Motrin for HA, safe for dcDr Duffy Rhody was available for consultation.ED CourseClinical Impressions as of 11/08/20 1157 Injury of head, initial encounter  ED DispositionDischarge Shayne Alken, PA08/13/22 1158

## 2020-11-11 ENCOUNTER — Other Ambulatory Visit: Payer: Self-pay

## 2020-11-11 ENCOUNTER — Encounter: Payer: Self-pay | Admitting: Student

## 2020-11-11 ENCOUNTER — Ambulatory Visit: Payer: Medicaid Other | Admitting: Student

## 2020-11-11 VITALS — BP 121/70 | HR 81 | Temp 98.0°F | Ht 62.0 in | Wt 295.6 lb

## 2020-11-11 DIAGNOSIS — T148XXA Other injury of unspecified body region, initial encounter: Secondary | ICD-10-CM | POA: Diagnosis not present

## 2020-11-11 DIAGNOSIS — R002 Palpitations: Secondary | ICD-10-CM | POA: Diagnosis not present

## 2020-11-11 MED ORDER — CYCLOBENZAPRINE HCL 5 MG PO TABS: 5.0000 mg | ORAL_TABLET | Freq: Three times a day (TID) | ORAL | 0 refills | Status: DC | PRN

## 2020-11-11 NOTE — Patient Instructions (Addendum)
You likely have a muscle injury affecting nerves in the neck. We will try a muscle relaxant to see if this helps with your symptoms. Also please take advil and tylenol as needed for your pain. You can also use heating pad.

## 2020-11-12 DIAGNOSIS — T148XXA Other injury of unspecified body region, initial encounter: Secondary | ICD-10-CM | POA: Insufficient documentation

## 2020-11-12 NOTE — Assessment & Plan Note (Addendum)
Patient reports that 3 days prior to her clinic visit she sustained trauma to her head while at a hotel.  She states that she was showering and the ceiling tiles above her fell onto her head.  She also notes that she sustained an injury to the right shoulder. She notes that she had a bruise on the top of her head but no other external evidence of injury. She has had neck pain that worsens with turning her head to the R and to the L. She feels the pain in the lateral aspect of R neck and the R side of her upper back. Patient also reports headache and eye pressure mostly on the R side and occasionally seeing spots. Finally, she notes decreased R grip strength and decreased sensation on the lateral aspect of her RUE. She denies confusion, N/V after her injury. She went to the hospital in [place] but, notes no imaging studies were performed at the time.  On exam, patient with 5/5 strength with bicep flexion, pain with palpation of trapezius muscle on anterior and posterior aspects and palpation of T1 spinous process.   A/P: Given patient's exam findings she likely experienced a muscle strain of the R trapezius with likely injury to the brachial plexus. Also considered intracranial pathology given patient's focal R grip weakness and dysesthesias though this is less likely. Discussed with patient to rest and to use flexeril as needed to help with her symptoms. She will follow up with our clinic in 1 week. If her symptoms worsen she was advised to go to the emergency room.

## 2020-11-12 NOTE — Progress Notes (Signed)
   CC: trauma to head, muscle injury   HPI:  [name] is a 40 y.o. F with PMH per below.  Please see assessment and plan under notes tab for further details.    Past Medical History:  Diagnosis Date   Chlamydia    Chronic back pain    Chronic neck pain    Complication of anesthesia    perceived problem with epidural   Depression    Drug abuse (HCC)    marijuana   Gall stones    GERD (gastroesophageal reflux disease)    History of chlamydia 01/04/2011   Hypercholesterolemia    Morbid obesity (HCC)    Smoker    Thoracic spine pain 08/24/2013   Review of Systems:  Please see assessment and plan under notes tab for further details.    Physical Exam:  Vitals:   11/11/20 1515  BP: 121/70  Pulse: 81  Temp: 98 F (36.7 C)  TempSrc: Oral  SpO2: 98%  Weight: 295 lb 9.6 oz (134.1 kg)  Height: 5\' 2"  (1.575 m)   Gen: NAD, drowsy  CV: RRR, no murmurs  Pulm: Non labored breathing on RA, no wheezing or crackles  Ext: No edema of BLE  Neuro: 2+BR, patella, bicep reflexes bilaterally, 5/5 BLE strength, RUE bicep 5/5 strength, grip strength decreased on R side normal on L side, LUE 5/5 strength throughout.  MSK: Anterior and posterior R trapezius TTP, no paraspinous TTP, midline TTP at T1  Assessment & Plan:   See Encounters Tab for problem based charting.  Patient seen with Dr. 

## 2020-11-13 ENCOUNTER — Encounter (HOSPITAL_COMMUNITY): Payer: Self-pay

## 2020-11-13 ENCOUNTER — Other Ambulatory Visit: Payer: Self-pay

## 2020-11-13 ENCOUNTER — Ambulatory Visit (HOSPITAL_COMMUNITY)
Admission: EM | Admit: 2020-11-13 | Discharge: 2020-11-13 | Disposition: A | Discharge status: Home / Self Care | Payer: Medicaid Other | Attending: Family Medicine | Admitting: Family Medicine

## 2020-11-13 DIAGNOSIS — R29898 Other symptoms and signs involving the musculoskeletal system: Secondary | ICD-10-CM | POA: Diagnosis not present

## 2020-11-13 DIAGNOSIS — R200 Anesthesia of skin: Secondary | ICD-10-CM | POA: Diagnosis not present

## 2020-11-13 DIAGNOSIS — T148XXA Other injury of unspecified body region, initial encounter: Secondary | ICD-10-CM

## 2020-11-13 MED ORDER — DEXAMETHASONE SODIUM PHOSPHATE 10 MG/ML IJ SOLN
10.0000 mg | Freq: Once | INTRAMUSCULAR | Status: AC
  Administered 2020-11-13: 10 mg via INTRAMUSCULAR

## 2020-11-13 MED ORDER — CYCLOBENZAPRINE HCL 5 MG PO TABS: 5.0000 mg | ORAL_TABLET | Freq: Three times a day (TID) | ORAL | 0 refills | Status: DC | PRN

## 2020-11-13 MED ORDER — DEXAMETHASONE SODIUM PHOSPHATE 10 MG/ML IJ SOLN
INTRAMUSCULAR | Status: AC
  Filled 2020-11-13: qty 1

## 2020-11-13 NOTE — ED Triage Notes (Signed)
Pt presents with headache and right side body numbness after a piece of her ceiling fell down and hit her on her head while she was taking a shower X 2 days ago; pt states she is having body numbness on right side from her neck down to her feet.

## 2020-11-13 NOTE — ED Provider Notes (Signed)
[facility]    CSN: [id number] Arrival date & time: 11/13/20  1542      History   Chief Complaint Chief Complaint  Patient presents with   Head Injury    HPI Vanessa Donaldson is a 40 y.o. female.   Patient presenting today with headache, right-sided upper extremity and lower extremity numbness and weakness since a ceiling with water damage fell on top of her head while she was at a hotel in [place] taking a shower.  It is unclear exactly what day this happened, she thinks it may have been 2 or 3 days ago but per her PCP note from 11/11/2020 it was stated that the incident was 3 days before that.  She states that she just had a bump on the top of her head toward the right side and some right-sided headache following the incident but is now increasingly more sore on the right upper neck and upper back region and having numbness, weakness on the entire right side of her body.  She denies visual changes, dizziness, nausea, vomiting, chest pain, shortness of breath, decreased range of motion.  Took a Flexeril she had at home and has been taking over-the-counter pain relievers with no relief.   Past Medical History:  Diagnosis Date   Chlamydia    Chronic back pain    Chronic neck pain    Complication of anesthesia    perceived problem with epidural   Depression    Drug abuse (HCC)    marijuana   Gall stones    GERD (gastroesophageal reflux disease)    History of chlamydia 01/04/2011   Hypercholesterolemia    Morbid obesity (HCC)    Smoker    Thoracic spine pain 08/24/2013    Patient Active Problem List   Diagnosis Date Noted   Muscle strain 11/12/2020   Intertrigo 08/12/2020   Orthopnea 08/12/2020   Soft tissue mass 08/12/2020   Mixed incontinence 08/12/2020   Right knee pain 08/08/2020   S/P laparoscopic cholecystectomy August 2014 11/20/2012   Active smoker 01/04/2011   Morbid obesity (HCC) 01/04/2011    Past Surgical History:  Procedure Laterality Date    BTL     CHOLECYSTECTOMY N/A 11/20/2012   Procedure: LAPAROSCOPIC CHOLECYSTECTOMY WITH INTRAOPERATIVE CHOLANGIOGRAM;  Surgeon: Valarie Merino, MD;  Location: [facility];  Service: General;  Laterality: N/A;   RECTAL EXAM UNDER ANESTHESIA  11/20/2012   Procedure: RECTAL EXAM UNDER ANESTHESIA;  Surgeon: Valarie Merino, MD;  Location: [facility];  Service: General;;   TUBAL LIGATION     had a baby since BTL    OB History     Gravida  5   Para  5   Term  5   Preterm      AB      Living  5      SAB      IAB      Ectopic      Multiple      Live Births               Home Medications    Prior to Admission medications   Medication Sig Start Date End Date Taking? Authorizing Provider  clotrimazole (LOTRIMIN) 1 % cream Apply 1 application topically 2 (two) times daily. To affected area for 2 weeks 08/08/20   Verdene Lennert, MD  cyclobenzaprine (FLEXERIL) 5 MG tablet Take 1 tablet (5 mg total) by mouth 3 (three) times daily as needed for muscle spasms. Do  not drink alcohol or drive while taking this medication. May cause drowsiness 11/13/20   Particia Nearing, PA-C    Family History Family History  Problem Relation Age of Onset   Edema Mother    Arthritis Mother    Emphysema Mother    Heart failure Father    Pancreatic cancer Other        Mat. Great Grandmother   Cancer Brother        Stage 1   Heart disease Brother        heart failure   Breast cancer Maternal Aunt        3 aunts   Stomach cancer Maternal Grandmother     Social History Social History   Tobacco Use   Smoking status: Every Day    Packs/day: 0.25    Years: 7.00    Pack years: 1.75    Types: Cigarettes   Smokeless tobacco: Never  Vaping Use   Vaping Use: Never used  Substance Use Topics   Alcohol use: Yes    Comment: occasional   Drug use: Never    Comment: denies     Allergies   Patient has no known allergies.   Review of Systems Review of Systems Per HPI  Physical  Exam Triage Vital Signs ED Triage Vitals  Enc Vitals Group     BP 11/13/20 1616 118/63     Pulse Rate 11/13/20 1616 78     Resp 11/13/20 1616 18     Temp 11/13/20 1616 98.5 F (36.9 C)     Temp Source 11/13/20 1616 Oral     SpO2 11/13/20 1616 98 %     Weight --      Height --      Head Circumference --      Peak Flow --      Pain Score 11/13/20 1615 6     Pain Loc --      Pain Edu? --      Excl. in GC? --    No data found.  Updated Vital Signs BP 118/63 (BP Location: Right Arm)   Pulse 78   Temp 98.5 F (36.9 C) (Oral)   Resp 18   SpO2 98%   Visual Acuity Right Eye Distance:   Left Eye Distance:   Bilateral Distance:    Right Eye Near:   Left Eye Near:    Bilateral Near:     Physical Exam Vitals and nursing note reviewed.  Constitutional:      Appearance: Normal appearance. She is not ill-appearing.  HENT:     Head: Atraumatic.     Mouth/Throat:     Mouth: Mucous membranes are moist.  Eyes:     Extraocular Movements: Extraocular movements intact.     Conjunctiva/sclera: Conjunctivae normal.  Cardiovascular:     Rate and Rhythm: Normal rate and regular rhythm.     Heart sounds: Normal heart sounds.  Pulmonary:     Effort: Pulmonary effort is normal.     Breath sounds: Normal breath sounds.  Abdominal:     General: Bowel sounds are normal. There is no distension.     Palpations: Abdomen is soft.     Tenderness: There is no abdominal tenderness.  Musculoskeletal:        General: Tenderness and signs of injury present. No swelling or deformity. Normal range of motion.     Cervical back: Normal range of motion and neck supple.     Comments: Tender to palpation  right cervical paraspinal muscles extending down into right latissimus and trapezius, mild spasm.  No midline spinal tenderness palpation diffusely.  Negative straight leg raise bilaterally.  Good range of motion all 4 extremities and all levels of back.  Steady gait  Skin:    General: Skin is warm  and dry.     Findings: No bruising, erythema or lesion.  Neurological:     Mental Status: She is alert and oriented to person, place, and time.     Sensory: Sensory deficit present.     Gait: Gait normal.     Comments: Decreased grip strength and sensation to light touch right upper extremity.  Otherwise appears focally neurologically intact  Psychiatric:        Mood and Affect: Mood normal.        Thought Content: Thought content normal.        Judgment: Judgment normal.     UC Treatments / Results  Labs (all labs ordered are listed, but only abnormal results are displayed) Labs Reviewed - No data to display  EKG   Radiology No results found.  Procedures Procedures (including critical care time)  Medications Ordered in UC Medications  dexamethasone (DECADRON) injection 10 mg (10 mg Intramuscular Given 11/13/20 1702)    Initial Impression / Assessment and Plan / UC Course  I have reviewed the triage vital signs and the nursing notes.  Pertinent labs & imaging results that were available during my care of the patient were reviewed by me and considered in my medical decision making (see chart for details).     No evidence of significant neurologic deficit at this time, suspect some inflammatory radicular nature of injury to the right upper and lower extremity.  We will start Flexeril, give IM Decadron in clinic as over-the-counter pain relievers not improving symptoms.  Rest, supportive home care and strict return precautions given.   Final Clinical Impressions(s) / UC Diagnoses   Final diagnoses:  Right arm numbness  Right leg weakness  Muscle strain   Discharge Instructions   None    ED Prescriptions     Medication Sig Dispense Auth. Provider   cyclobenzaprine (FLEXERIL) 5 MG tablet Take 1 tablet (5 mg total) by mouth 3 (three) times daily as needed for muscle spasms. Do not drink alcohol or drive while taking this medication. May cause drowsiness 10 tablet  Particia Nearing, [place]      PDMP not reviewed this encounter.   Particia Nearing, [place] 11/13/20 1708

## 2020-11-18 ENCOUNTER — Encounter: Payer: Medicaid Other | Admitting: Internal Medicine

## 2020-11-18 DIAGNOSIS — N3946 Mixed incontinence: Secondary | ICD-10-CM | POA: Diagnosis not present

## 2020-11-19 ENCOUNTER — Ambulatory Visit (HOSPITAL_COMMUNITY)
Admission: RE | Admit: 2020-11-19 | Discharge: 2020-11-19 | Disposition: A | Discharge status: Home / Self Care | Payer: Medicaid Other | Source: Ambulatory Visit | Attending: Internal Medicine | Admitting: Internal Medicine

## 2020-11-19 ENCOUNTER — Other Ambulatory Visit: Payer: Self-pay

## 2020-11-19 ENCOUNTER — Ambulatory Visit: Payer: Medicaid Other | Admitting: Internal Medicine

## 2020-11-19 ENCOUNTER — Encounter: Payer: Self-pay | Admitting: Internal Medicine

## 2020-11-19 VITALS — BP 116/51 | HR 76 | Temp 98.4°F | Ht 62.0 in | Wt 290.3 lb

## 2020-11-19 DIAGNOSIS — T148XXA Other injury of unspecified body region, initial encounter: Secondary | ICD-10-CM | POA: Diagnosis not present

## 2020-11-19 DIAGNOSIS — F331 Major depressive disorder, recurrent, moderate: Secondary | ICD-10-CM | POA: Diagnosis not present

## 2020-11-19 DIAGNOSIS — Z1231 Encounter for screening mammogram for malignant neoplasm of breast: Secondary | ICD-10-CM

## 2020-11-19 DIAGNOSIS — S199XXA Unspecified injury of neck, initial encounter: Secondary | ICD-10-CM | POA: Diagnosis not present

## 2020-11-19 DIAGNOSIS — M25511 Pain in right shoulder: Secondary | ICD-10-CM | POA: Diagnosis not present

## 2020-11-19 DIAGNOSIS — M50322 Other cervical disc degeneration at C5-C6 level: Secondary | ICD-10-CM | POA: Diagnosis not present

## 2020-11-19 DIAGNOSIS — R200 Anesthesia of skin: Secondary | ICD-10-CM | POA: Diagnosis not present

## 2020-11-19 MED ORDER — GABAPENTIN 300 MG PO CAPS: 300.0000 mg | ORAL_CAPSULE | Freq: Two times a day (BID) | ORAL | 0 refills | Status: DC

## 2020-11-19 NOTE — Progress Notes (Signed)
CC: R sided pain and weakness  HPI:  [name] is a 40 y.o. female with past medical history as detailed below who presents for follow-up for pain that is related to a recent accident in [place] where ceiling in a hotel shower fell onto her head. She since has been evaluated twice--once in clinic and once in the ED for the pain. Please see problem based charting for detailed assessment and plan.   Past Medical History:  Diagnosis Date   Chlamydia    Chronic back pain    Chronic neck pain    Complication of anesthesia    perceived problem with epidural   Depression    Drug abuse (HCC)    marijuana   Gall stones    GERD (gastroesophageal reflux disease)    History of chlamydia 01/04/2011   Hypercholesterolemia    Morbid obesity (HCC)    Smoker    Thoracic spine pain 08/24/2013   Review of Systems:  Review of Systems  Constitutional:  Negative for malaise/fatigue and weight loss.  Eyes:        Positive for intermittent eyelid twitching.  Respiratory:  Negative for shortness of breath.   Cardiovascular:  Negative for chest pain.  Musculoskeletal:  Positive for back pain, joint pain, myalgias and neck pain. Negative for falls.  Neurological:  Positive for tingling, weakness and headaches.  Psychiatric/Behavioral:  Positive for depression. The patient is nervous/anxious.     Physical Exam:  Vitals:   11/19/20 0905  BP: (!) 116/51  Pulse: 76  Temp: 98.4 F (36.9 C)  TempSrc: Oral  SpO2: 98%  Weight: 290 lb 4.8 oz (131.7 kg)  Height: 5\' 2"  (1.575 m)   Physical Exam Vitals and nursing note reviewed.  Constitutional:      Appearance: She is obese.  HENT:     Head: Normocephalic and atraumatic.  Eyes:     Extraocular Movements: Extraocular movements intact.  Cardiovascular:     Rate and Rhythm: Normal rate and regular rhythm.     Heart sounds: Normal heart sounds.  Pulmonary:     Effort: Pulmonary effort is normal.     Breath sounds: Normal breath  sounds.  Musculoskeletal:     Right shoulder: Tenderness present.     Left shoulder: Normal.     Right upper arm: Tenderness present. No swelling or deformity.     Left upper arm: Normal.     Right forearm: Tenderness present. No swelling.     Left forearm: Normal.     Cervical back: Spasms (R sided) and tenderness present. Pain with movement present.     Thoracic back: Spasms (R sided over area of trapezius) and tenderness present.     Lumbar back: Normal.     Right upper leg: Tenderness present.     Left upper leg: Normal.     Right lower leg: Tenderness present.     Left lower leg: Normal.     Comments: No spinous process tenderness noted.  Skin:    General: Skin is warm and dry.  Neurological:     Mental Status: She is alert and oriented to person, place, and time.     Sensory: Sensation is intact.     Comments: RUE strength 4+, LUE strength 5+. Sensation equal bilaterally.  RLE strength 5+, LLE strength 5+. Sensation unequal with RLE sensation decreased compared to L.  Patient experiencing difficulty with ambulation and needed wheelchair assistance for longer distances.  Psychiatric:  Mood and Affect: Mood is anxious and depressed. Affect is tearful.        Behavior: Behavior normal. Behavior is cooperative.     Assessment & Plan:   See Encounters Tab for problem based charting.  Patient seen with Dr. Heide Spark
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
## 2020-11-19 NOTE — Assessment & Plan Note (Addendum)
Patient experienced trauma to the head in early August when a ceiling fell on her in a shower at a hotel that she was staying at in [place]. She has since been evaluated twice in [place]--once in clinic on 11/11/2020 and again in the ED on 11/13/2020. In this time she has trialed several pain relief modalities including Advil, tylenol, aleve, flexeril, and IM decadron in the ED with no relief. She states that the pain has gotten worse over time and she is now limited in ADLs due to weakness, and is relying on services like [employer] for grocery delivery and her children to help her get around.   The pain she describes is sharp in her R leg with subjective weakness and difficulty with ambulation, and numbness with tingling of the R arm. She experiences pain when turning her head and her upper and mid thorax is tender to palpation with constant pain likely related to spasms of the trapezius. She also endorses eyelid twitching since experiencing this trauma.  Not able to locate imaging. - Gabapentin 300 mg each night; counseled patient that she can take it once during the day as well if needed but that it is a medication that can make her drowsy so she should not try to drive if she takes it - X-ray cervical spine - Physical therapy referral placed - Follow up in 1 week  Addendum 11/25/2020: C spine x-ray showed degenerative disc disease from C3-C4 through C5-C6 and bilateral C5-C6 bony neural foraminal narrowing. Will order CT spine to further evaluate both upper and lower extremity complaints.

## 2020-11-24 NOTE — Progress Notes (Signed)
Internal Medicine Clinic Attending  I saw and evaluated the patient.  I personally confirmed the key portions of the history and exam documented by Dr.  Dean  and I reviewed pertinent patient test results.  The assessment, diagnosis, and plan were formulated together and I agree with the documentation in the resident's note.  

## 2020-11-25 ENCOUNTER — Telehealth: Payer: Self-pay | Admitting: Internal Medicine

## 2020-11-25 NOTE — Telephone Encounter (Signed)
Spoke with patient to inform her of x-ray results. I explained that since she is still having symptoms that I would like to get a CT due to the nature of her injury and symptoms (traumatic injury, pain and radiculopathy of R neck, arm, back, leg). CT spine ordered. She also requested that I re-send a mammogram referral which I have done.

## 2020-11-25 NOTE — Addendum Note (Signed)
Addended by: Ihor Dow on: 11/25/2020 11:00 AM   Modules accepted: Orders

## 2020-11-25 NOTE — Telephone Encounter (Signed)
Pt called requesting her Test Results. Patent became upset that no one has called her back since 11/19/2020 with her X-ray Results.

## 2020-11-25 NOTE — Progress Notes (Signed)
Internal Medicine Clinic Attending   I saw and evaluated the patient.  I personally confirmed the key portions of the history and exam documented by Dr. Carter and I reviewed pertinent patient test results.  The assessment, diagnosis, and plan were formulated together and I agree with the documentation in the resident's note.  

## 2020-12-03 ENCOUNTER — Other Ambulatory Visit: Payer: Self-pay

## 2020-12-03 ENCOUNTER — Ambulatory Visit: Payer: Medicaid Other | Attending: Internal Medicine

## 2020-12-03 DIAGNOSIS — M5441 Lumbago with sciatica, right side: Secondary | ICD-10-CM | POA: Insufficient documentation

## 2020-12-03 DIAGNOSIS — T148XXA Other injury of unspecified body region, initial encounter: Secondary | ICD-10-CM | POA: Diagnosis not present

## 2020-12-03 DIAGNOSIS — M5386 Other specified dorsopathies, lumbar region: Secondary | ICD-10-CM | POA: Insufficient documentation

## 2020-12-03 DIAGNOSIS — M549 Dorsalgia, unspecified: Secondary | ICD-10-CM | POA: Diagnosis not present

## 2020-12-03 DIAGNOSIS — M542 Cervicalgia: Secondary | ICD-10-CM | POA: Diagnosis not present

## 2020-12-05 NOTE — Therapy (Addendum)
[facility] Outpatient Rehabilitation [facility] [address] [place], [place], [address] Phone: [phone]   Fax:  [phone]  Physical Therapy Evaluation  Patient Details  Name: Vanessa Donaldson MRN: [id number] Date of Birth: 10/29/1980 Referring Provider (PT): Earl Lagos, MD   Encounter Date: 12/03/2020   PT End of Session - 12/05/20 0505     Visit Number 1    Number of Visits 17    Date for PT Re-Evaluation 02/07/21    Authorization Type [place] MEDICAID HEALTHY BLUE    Progress Note Due on Visit 10    PT Start Time 0810   20 mins late for appt   PT Stop Time 0855    PT Time Calculation (min) 45 min    Activity Tolerance Patient limited by pain    Behavior During Therapy Restless             Past Medical History:  Diagnosis Date   Chlamydia    Chronic back pain    Chronic neck pain    Complication of anesthesia    perceived problem with epidural   Depression    Drug abuse (HCC)    marijuana   Gall stones    GERD (gastroesophageal reflux disease)    History of chlamydia 01/04/2011   Hypercholesterolemia    Morbid obesity (HCC)    Smoker    Thoracic spine pain 08/24/2013    Past Surgical History:  Procedure Laterality Date   BTL     CHOLECYSTECTOMY N/A 11/20/2012   Procedure: LAPAROSCOPIC CHOLECYSTECTOMY WITH INTRAOPERATIVE CHOLANGIOGRAM;  Surgeon: Valarie Merino, MD;  Location: [facility];  Service: General;  Laterality: N/A;   RECTAL EXAM UNDER ANESTHESIA  11/20/2012   Procedure: RECTAL EXAM UNDER ANESTHESIA;  Surgeon: Valarie Merino, MD;  Location: [facility];  Service: General;;   TUBAL LIGATION     had a baby since BTL    There were no vitals filed for this visit.    Subjective Assessment - 12/05/20 0620     Subjective Pt reports her her neck, mid back and low back were injuried when part a shower ceiling from a hotel she was staying at collapsed and fell, hitting her neck and shoulders. Pt notes her low back pain is the most significant  and requested this area be assessed first. Pt reports low back, R>L, and R leg pain, The R leg pain is of the whole leg and foot with numbness of the whole leg and foot. She notes the pain is most prominant of the R ant/lat thigh, Pt states the pain is so great it feels like her R leg and head are going to fall off. She rates the pain of her neck, mid back and low back as a 7-10/10. Pt notes pain and numbess of her whole R arm and hand. Pt reports she needs assistance with dressing.    Limitations Sitting;Lifting;Standing;Walking;Writing;House hold activities    How long can you sit comfortably? 5 mins. Not able to get comfortable changing positions frequently    How long can you stand comfortably? 5 mins    How long can you walk comfortably? a couple mins    Diagnostic tests CT scans have been ordered for the cervical, thoracic and lumbar regions. 11/19/20- Cervical Xray; FINDINGS:  Similar straightening of normal lordosis. No listhesis. Disc space  narrowing and endplate spurring O9-G2, C4-C5, and C5-C6. No  significant facet changes. There is C5-C6 bony neural foraminal  narrowing bilaterally. No evidence of fracture,  focal bone  abnormality or bony destruction. There is no prevertebral soft  tissue edema.     IMPRESSION:  1. 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  2. Bilateral C5-C6 bony neural foraminal narrowing.    Patient Stated Goals "For you to fix all this so I can get going"    Currently in Pain? Yes    Pain Score 9    7-10/10 pain range   Pain Location Back    Pain Orientation Right;Left;Posterior;Mid;Lower    Pain Descriptors / Indicators Burning;Aching;Sharp;Throbbing    Pain Type Acute pain    Pain Radiating Towards whole R leg and foot    Pain Onset 1 to 4 weeks ago    Pain Frequency Constant    Aggravating Factors  Standing-crushing feeling of the spine    Pain Relieving Factors Pain medication, muscle relaxors, hot and cold packs. All provide only temporary relief.     Multiple Pain Sites Yes    Pain Score 9    Pain Location Neck    Pain Orientation Right;Left    Pain Descriptors / Indicators Aching;Burning;Throbbing;Sharp    Pain Type Acute pain    Pain Radiating Towards whole R arm and hand    Pain Onset 1 to 4 weeks ago    Pain Frequency Constant                [facility] PT Assessment - 12/05/20 0001       Assessment   Medical Diagnosis Muscle strain    Referring Provider (PT) Earl LagosNarendra, Nischal, MD    Onset Date/Surgical Date 11/08/20    Hand Dominance Right    Next MD Visit in approx 2 weeks    Prior Therapy No PT; went to 1 chiropractor visit but has but has not returned      Precautions   Precautions None      Restrictions   Weight Bearing Restrictions No      Balance Screen   Has the patient fallen in the past 6 months No      Home Environment   Living Environment Private residence    Living Arrangements Children    Type of Home House    Home Access Stairs to enter    Entrance Stairs-Number of Steps 3    Entrance Stairs-Rails None    Home Layout One level      Prior Function   Level of Independence Independent    Vocation Full time employment   out due to injury   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, standing, sitting    Leisure Walking      Cognition   Overall Cognitive Status Within Functional Limits for tasks assessed      Observation/Other Assessments   Focus on Therapeutic Outcomes (FOTO)  NA-MCD      Sensation   Light Touch Impaired by gross assessment   R leg/foot and arm/hand decreased in comparison to the L for light touch     Posture/Postural Control   Posture/Postural Control Postural limitations    Postural Limitations Forward head;Decreased thoracic kyphosis      Deep Tendon Reflexes   DTR Assessment Site Patella;Achilles    Patella DTR 1+   for R, L 2+   Achilles DTR 2+   bilat     ROM / Strength   AROM / PROM / Strength AROM;Strength      AROM   Overall AROM  Comments AROM for LEs were WFLs per functional movements completed  during assessment. Trunk ROM assessment was DCed after extension due to pain. Pt needed to sit and rest after each trunk movement    AROM Assessment Site Lumbar    Lumbar Flexion 25% limited, increase in R>L low back and R LE pain, 5 inches below distal patella    Lumbar Extension Full, increase in R>L low back pain      Strength   Overall Strength Comments R hip and knee strength was decreased with pt reporting pain with each MMT    Strength Assessment Site Hip;Knee;Ankle    Right/Left Hip Right;Left    Right Hip Flexion 4/5    Right Hip ABduction 4/5    Left Hip Flexion 5/5    Left Hip ABduction 5/5    Right/Left Knee Right;Left    Right Knee Flexion 4/5    Right Knee Extension 4/5    Left Knee Flexion 5/5    Left Knee Extension 5/5    Right/Left Ankle Right;Left    Right Ankle Dorsiflexion 5/5    Right Ankle Plantar Flexion 5/5    Left Ankle Plantar Flexion 5/5    Left Ankle Inversion 5/5      Palpation   Palpation comment not assessed due to time constraint      Bed Mobility   Bed Mobility Rolling Right;Rolling Left;Supine to Sit    Rolling Right Independent   painful     Transfers   Transfers Sit to Stand;Stand to Sit    Sit to Stand 6: Modified independent (Device/Increase time)   painful and labored     Ambulation/Gait   Ambulation/Gait Yes    Ambulation/Gait Assistance 6: Modified independent (Device/Increase time)    Gait Pattern Step-through pattern;Antalgic   R LE   Gait velocity Decreased pace                        Objective measurements completed on examination: See above findings.                PT Education - 12/05/20 0504     Education Details Eval findings, POC, use of heat and cold packs for pain management, activity as tolerated    Person(s) Educated Patient    Methods Explanation;Demonstration;Tactile cues;Verbal cues;Handout    Comprehension Verbalized  understanding;Returned demonstration;Verbal cues required;Tactile cues required              PT Short Term Goals - 12/05/20 0606       PT SHORT TERM GOAL #1   Title Pt will be Ind in an initial HEP    Status New    Target Date 12/26/20      PT SHORT TERM GOAL #2   Title Pt will voice understanding of measures to assist in the reduction of pain    Status New    Target Date 12/26/20               PT Long Term Goals - 12/05/20 0608       PT LONG TERM GOAL #1   Title Pt will be Ind in a final HEP to maintain achieved LOF    Status New    Target Date 02/07/21      PT LONG TERM GOAL #2   Title Increased trunk flexion ROM to full motion with the pt reaching to 1 inch above the ankles for improved functional mobilit    Baseline 5 inches below distal patella    Status New  Target Date 02/07/21      PT LONG TERM GOAL #3   Title Pt will report improved low back and R LE pain to 4/10 or less for 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    Baseline 7-10/10    Status New    Target Date 02/07/21      PT LONG TERM GOAL #4   Title Incresae R hip and knee strength to 4+/5 or greater to improve functional abilities 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 PT assessment revealed decreased light touch of the R leg/foot and R arm/hand in comparison to the L per pt's report, decreased R patellar reflex, decreased R hip and knee strength with pt reporting  pain with each MMT, and min decreased trunk ROM with increased pain with each movement. Flexion and extension were assessed with pt needing to sit between each movement. Due increased pain at this point, the assessment was stopped. Pt received moist heat to the low back for 20 mins which pt reported helped to lower her back pain. Pt was able to walk out of the appt in the same manner as when she arrived. Pt may benefit from a course of skilled PT 2w8 to address deficits and pain to optimizeher functional mobility.    Personal Factors and Comorbidities Fitness;Comorbidity 1;Comorbidity 2;Comorbidity 3+    Comorbidities morbid obesiity, drug abuse, smoker, depression, hypercholesterolemia, chronic neck and back pain    Examination-Activity Limitations Locomotion Level;Bed Mobility;Sit;Squat;Stairs;Stand;Lift;Carry;Transfers;Dressing    Examination-Participation Restrictions Other   ADLs   Stability/Clinical Decision Making Evolving/Moderate complexity    Clinical Decision Making Moderate    Rehab Potential Fair    PT Frequency 2x / week    PT Duration 8 weeks    PT Treatment/Interventions ADLs/Self Care Home Management;Aquatic Therapy;Cryotherapy;Electrical Stimulation;Moist Heat;Ultrasound;Therapeutic exercise;Therapeutic activities;Functional mobility training;Stair training;Gait training;Patient/family education;Manual techniques;Spinal Manipulations;Dry needling;Passive range of motion;Taping    PT Next Visit Plan Continue assessment of low back, neck and mid back. Provide PT care as indicated.    Consulted and Agree with Plan of Care Patient             Patient will benefit from skilled therapeutic intervention in order to improve the following deficits and impairments:  Difficulty walking, Decreased range of motion, Obesity, Decreased activity tolerance, Pain, Decreased strength, Impaired sensation  Visit Diagnosis: Muscle strain  Acute bilateral low back pain with right-sided  sciatica  Mid-back pain, acute  Pain of cervical spine  Decreased ROM of lumbar spine     Problem List Patient Active Problem List   Diagnosis Date Noted   Moderate episode of recurrent major depressive disorder (HCC) 11/19/2020   Muscle strain 11/12/2020   Intertrigo 08/12/2020   Orthopnea 08/12/2020   Soft  tissue mass 08/12/2020   Mixed incontinence 08/12/2020   Right knee pain 08/08/2020   S/P laparoscopic cholecystectomy August 2014 11/20/2012   Active smoker 01/04/2011   Morbid obesity (HCC) 01/04/2011    Joellyn Rued, PT 12/05/2020, 6:23 AM  [facility] [address] [place], [place], [address] Phone: [phone]   Fax:  [phone]  Name: Vanessa Donaldson MRN: [id number] Date of Birth: 1981/02/21  Check all possible CPT codes: 97110- Therapeutic Exercise, [phone] - Gait Training, 97140 - Manual Therapy, 97530 - Therapeutic Activities, 97535 - Self Care, [phone] - Mechanical traction, [id number] - Electrical stimulation (Manual), and [id number] - Ultrasound

## 2020-12-10 ENCOUNTER — Ambulatory Visit: Payer: Medicaid Other

## 2020-12-10 ENCOUNTER — Other Ambulatory Visit: Payer: Self-pay

## 2020-12-10 DIAGNOSIS — M5386 Other specified dorsopathies, lumbar region: Secondary | ICD-10-CM | POA: Diagnosis not present

## 2020-12-10 DIAGNOSIS — M5441 Lumbago with sciatica, right side: Secondary | ICD-10-CM | POA: Diagnosis not present

## 2020-12-10 DIAGNOSIS — M542 Cervicalgia: Secondary | ICD-10-CM

## 2020-12-10 DIAGNOSIS — M549 Dorsalgia, unspecified: Secondary | ICD-10-CM

## 2020-12-10 DIAGNOSIS — T148XXA Other injury of unspecified body region, initial encounter: Secondary | ICD-10-CM | POA: Diagnosis not present

## 2020-12-10 NOTE — Therapy (Signed)
[facility] Outpatient Rehabilitation [facility] [address] [place], [place], [address] Phone: [phone]   Fax:  [phone]  Physical Therapy Treatment  Patient Details  Name: Vanessa Donaldson MRN: [id number] Date of Birth: [birth year] Referring Provider (PT): Earl Lagos, MD   Encounter Date: 12/10/2020   PT End of Session - 12/10/20 1426     Visit Number 2    Number of Visits 17    Date for PT Re-Evaluation 02/07/21    Authorization Type [place] MEDICAID HEALTHY BLUE    Progress Note Due on Visit 10    PT Start Time 1422    PT Stop Time 1520    PT Time Calculation (min) 58 min    Activity Tolerance Patient limited by pain    Behavior During Therapy Restless             Past Medical History:  Diagnosis Date   Chlamydia    Chronic back pain    Chronic neck pain    Complication of anesthesia    perceived problem with epidural   Depression    Drug abuse (HCC)    marijuana   Gall stones    GERD (gastroesophageal reflux disease)    History of chlamydia 01/04/2011   Hypercholesterolemia    Morbid obesity (HCC)    Smoker    Thoracic spine pain 08/24/2013    Past Surgical History:  Procedure Laterality Date   BTL     CHOLECYSTECTOMY N/A 11/20/2012   Procedure: LAPAROSCOPIC CHOLECYSTECTOMY WITH INTRAOPERATIVE CHOLANGIOGRAM;  Surgeon: Valarie Merino, MD;  Location: [facility];  Service: General;  Laterality: N/A;   RECTAL EXAM UNDER ANESTHESIA  11/20/2012   Procedure: RECTAL EXAM UNDER ANESTHESIA;  Surgeon: Valarie Merino, MD;  Location: [facility];  Service: General;;   TUBAL LIGATION     had a baby since BTL    There were no vitals filed for this visit.   Subjective Assessment - 12/10/20 1428     Subjective Pt reports her pain levels are not improved. Still having whole L arm numbess and whole L leg pain and numbness    Pain Score 10-Worst pain ever    Pain Location Back    Pain Orientation Right;Left;Mid;Lower    Pain Descriptors / Indicators  Sharp    Pain Type Acute pain    Pain Onset 1 to 4 weeks ago    Pain Frequency Constant    Multiple Pain Sites Yes    Pain Score 9    Pain Location Neck    Pain Orientation Right;Left    Pain Descriptors / Indicators Aching    Pain Frequency Constant    Aggravating Factors  Static positions    Pain Relieving Factors Moving the neck                               [facility] Adult PT Treatment/Exercise - 12/10/20 0001       Exercises   Exercises Lumbar      Lumbar Exercises: Stretches   Single Knee to Chest Stretch Right;Left;3 reps;10 seconds    Lower Trunk Rotation 5 reps   5 sec     Lumbar Exercises: Supine   Pelvic Tilt 10 reps   3 sec   Glut Set 10 reps;3 seconds    Glut Set Limitations knees bent      Modalities   Modalities Moist Heat      Moist Heat Therapy  Number Minutes Moist Heat 20 Minutes    Moist Heat Location Lumbar Spine                     PT Education - 12/10/20 1754     Education Details HEP initiated    Person(s) Educated Patient    Methods Explanation;Demonstration;Tactile cues;Verbal cues;Handout    Comprehension Verbalized understanding;Returned demonstration;Verbal cues required;Tactile cues required;Need further instruction              PT Short Term Goals - 12/05/20 0606       PT SHORT TERM GOAL #1   Title Pt will be Ind in an initial HEP    Status New    Target Date 12/26/20      PT SHORT TERM GOAL #2   Title Pt will voice understanding of measures to assist in the reduction of pain    Status New    Target Date 12/26/20               PT Long Term Goals - 12/05/20 0608       PT LONG TERM GOAL #1   Title Pt will be Ind in a final HEP to maintain achieved LOF    Status New    Target Date 02/07/21      PT LONG TERM GOAL #2   Title Increased trunk flexion ROM to full motion with the pt reaching to 1 inch above the ankles for improved functional mobilit    Baseline 5 inches below distal  patella    Status New    Target Date 02/07/21      PT LONG TERM GOAL #3   Title Pt will report improved low back and R LE pain to 4/10 or less for 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    Baseline 7-10/10    Status New    Target Date 02/07/21      PT LONG TERM GOAL #4   Title Incresae R hip and knee strength to 4+/5 or greater to 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 Pt was not able to tolerated bridging so it was subsituted with gluteal sets. Pt demonstrated greater than exspected trunk and LE ROM with the LTR and SKTC. Moist heat was applied following ther ex to assit with pain management. Pt tolerated the session without adverse effects. A written HEP as provided. Pt returned demonstration of ther exs.    Personal Factors and Comorbidities Fitness;Comorbidity 1;Comorbidity 2;Comorbidity 3+    Comorbidities morbid obesiity, drug abuse, smoker, depression, hypercholesterolemia, chronic neck and back pain    Examination-Activity Limitations Locomotion Level;Bed Mobility;Sit;Squat;Stairs;Stand;Lift;Carry;Transfers;Dressing    Examination-Participation Restrictions Other    Stability/Clinical Decision Making Evolving/Moderate complexity    Clinical Decision Making Moderate    Rehab Potential Fair  PT Frequency 2x / week    PT Duration 8 weeks    PT Treatment/Interventions ADLs/Self Care Home Management;Aquatic Therapy;Cryotherapy;Electrical Stimulation;Moist  Heat;Ultrasound;Therapeutic exercise;Therapeutic activities;Functional mobility training;Stair training;Gait training;Patient/family education;Manual techniques;Spinal Manipulations;Dry needling;Passive range of motion;Taping    PT Next Visit Plan Continue assessment of low back, neck and mid back. Provide PT care as indicated.    Consulted and Agree with Plan of Care Patient             Patient will benefit from skilled therapeutic intervention in order to improve the following deficits and impairments:  Difficulty walking, Decreased range of motion, Obesity, Decreased activity tolerance, Pain, Decreased strength, Impaired sensation  Visit Diagnosis: Muscle strain  Mid-back pain, acute  Pain of cervical spine  Decreased ROM of lumbar spine  Acute bilateral low back pain with right-sided sciatica     Problem List Patient Active Problem List   Diagnosis Date Noted   Moderate episode of recurrent major depressive disorder (HCC) 11/19/2020   Muscle strain 11/12/2020   Intertrigo 08/12/2020   Orthopnea 08/12/2020   Soft tissue mass 08/12/2020   Mixed incontinence 08/12/2020   Right knee pain 08/08/2020   S/P laparoscopic cholecystectomy August 2014 11/20/2012   Active smoker 01/04/2011   Morbid obesity (HCC) 01/04/2011    Joellyn Rued, PT 12/10/2020, 6:05 PM  [facility] Outpatient Rehabilitation [facility] [address] [place], [place], [address] Phone: [phone]   Fax:  [phone]  Name: Vanessa Donaldson MRN: [id number] Date of Birth: 01/31/1981

## 2020-12-12 ENCOUNTER — Telehealth: Payer: Self-pay

## 2020-12-12 ENCOUNTER — Ambulatory Visit: Payer: Medicaid Other

## 2020-12-12 NOTE — Telephone Encounter (Signed)
LVM re: no show visit, attendance policy, and upcoming appt on 12/16/20.

## 2020-12-16 ENCOUNTER — Ambulatory Visit: Payer: Medicaid Other

## 2020-12-17 ENCOUNTER — Telehealth: Payer: Self-pay | Admitting: Internal Medicine

## 2020-12-17 ENCOUNTER — Other Ambulatory Visit: Payer: Self-pay | Admitting: Internal Medicine

## 2020-12-17 DIAGNOSIS — Z87898 Personal history of other specified conditions: Secondary | ICD-10-CM

## 2020-12-17 NOTE — Telephone Encounter (Signed)
Spoke with [facility] a new order is requested.  This Pt was seen 11/19/2020 in the [facility].  The pt has stated she has a lump in her right breast and they will not be able to sch until her order has been changed.  Per [facility] the Pt will need a Bilateral Dx and an Ultrasound of the Right Breast entered in order to schedule this patient as soon as possible.  Please advise if a new order can be placed or if another appointment is needed.

## 2020-12-17 NOTE — Progress Notes (Signed)
Orders placed as requested for further evaluation of R.Breast lump.

## 2020-12-18 ENCOUNTER — Ambulatory Visit: Payer: Medicaid Other | Admitting: Physical Therapy

## 2020-12-18 ENCOUNTER — Other Ambulatory Visit: Payer: Self-pay

## 2020-12-18 ENCOUNTER — Encounter: Payer: Self-pay | Admitting: Physical Therapy

## 2020-12-18 DIAGNOSIS — M5386 Other specified dorsopathies, lumbar region: Secondary | ICD-10-CM

## 2020-12-18 DIAGNOSIS — M5441 Lumbago with sciatica, right side: Secondary | ICD-10-CM

## 2020-12-18 DIAGNOSIS — M542 Cervicalgia: Secondary | ICD-10-CM | POA: Diagnosis not present

## 2020-12-18 DIAGNOSIS — M549 Dorsalgia, unspecified: Secondary | ICD-10-CM

## 2020-12-18 DIAGNOSIS — T148XXA Other injury of unspecified body region, initial encounter: Secondary | ICD-10-CM | POA: Diagnosis not present

## 2020-12-18 NOTE — Therapy (Signed)
[facility] Outpatient Rehabilitation [facility] [address] [place], [place], [address] Phone: [phone]   Fax:  [phone]  Physical Therapy Treatment  Patient Details  Name: Vanessa Donaldson MRN: [id number] Date of Birth: Jun 29, 1980 Referring Provider (PT): Earl Lagos, MD   Encounter Date: 12/18/2020   PT End of Session - 12/18/20 1651     Visit Number 3    Number of Visits 17    Date for PT Re-Evaluation 02/07/21    Authorization Type [place] MEDICAID HEALTHY BLUE    Progress Note Due on Visit 10    PT Start Time 1650    PT Stop Time 1723    PT Time Calculation (min) 33 min    Activity Tolerance Patient limited by pain    Behavior During Therapy Restless             Past Medical History:  Diagnosis Date   Chlamydia    Chronic back pain    Chronic neck pain    Complication of anesthesia    perceived problem with epidural   Depression    Drug abuse (HCC)    marijuana   Gall stones    GERD (gastroesophageal reflux disease)    History of chlamydia 01/04/2011   Hypercholesterolemia    Morbid obesity (HCC)    Smoker    Thoracic spine pain 08/24/2013    Past Surgical History:  Procedure Laterality Date   BTL     CHOLECYSTECTOMY N/A 11/20/2012   Procedure: LAPAROSCOPIC CHOLECYSTECTOMY WITH INTRAOPERATIVE CHOLANGIOGRAM;  Surgeon: Valarie Merino, MD;  Location: [facility];  Service: General;  Laterality: N/A;   RECTAL EXAM UNDER ANESTHESIA  11/20/2012   Procedure: RECTAL EXAM UNDER ANESTHESIA;  Surgeon: Valarie Merino, MD;  Location: [facility];  Service: General;;   TUBAL LIGATION     had a baby since BTL    There were no vitals filed for this visit.   Subjective Assessment - 12/18/20 1657     Subjective Pt reports that she is still having very high levels of pain with minimal improvement.    Diagnostic tests CT scans have been ordered for the cervical, thoracic and lumbar regions. 11/19/20- Cervical Xray; FINDINGS:  Similar straightening of normal  lordosis. No listhesis. Disc space  narrowing and endplate spurring Z1-I9, C4-C5, and C5-C6. No  significant facet changes. There is C5-C6 bony neural foraminal  narrowing bilaterally. No evidence of fracture, focal bone  abnormality or bony destruction. There is no prevertebral soft  tissue edema.     IMPRESSION:  1. 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  2. Bilateral C5-C6 bony neural foraminal narrowing.    Currently in Pain? Yes    Pain Score 10-Worst pain ever    Pain Location Back    Pain Onset 1 to 4 weeks ago    Pain Score 8    Pain Location Neck    Pain Orientation Right;Left             [facility] Adult PT Treatment/Exercise:  Therapeutic Exercise: - nu-step L3 32m while taking subjective and planning session with patient - LTR - 20x - Pelvic tilt 2x10 5'' - Hooklying bridge - 2x10 - Hip adduction ball squeeze  - 5'' x10 - alternating bent knee fall out - 2x10 ea - SLR - 10x ea - RTB row - 3x10 ea - RTB ext - 3x10 ea  Modalities  Heat, applied to lumbar spine during mat exercises     PT Short Term  Goals - 12/05/20 0606       PT SHORT TERM GOAL #1   Title Pt will be Ind in an initial HEP    Status New    Target Date 12/26/20      PT SHORT TERM GOAL #2   Title Pt will voice understanding of measures to assist in the reduction of pain    Status New    Target Date 12/26/20               PT Long Term Goals - 12/05/20 0608       PT LONG TERM GOAL #1   Title Pt will be Ind in a final HEP to maintain achieved LOF    Status New    Target Date 02/07/21      PT LONG TERM GOAL #2   Title Increased trunk flexion ROM to full motion with the pt reaching to 1 inch above the ankles for improved functional mobilit    Baseline 5 inches below distal patella    Status New    Target Date 02/07/21      PT LONG TERM GOAL #3   Title Pt will report improved low back and R LE pain to 4/10 or less for improved functional ability 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  When performing mat exercises pt reports visual disturbance including seeing small grey feathers in the air.  Visual screen completed and was (-).  Pt seemed significantly agitated by visual disturbance so session was ended early.  She reports resolution of visual change by end of session.  I instructed her to seek medical care if visual changes persist; she confirms understanding.    Personal Factors and Comorbidities Fitness;Comorbidity 1;Comorbidity 2;Comorbidity 3+    Comorbidities morbid obesiity, drug abuse, smoker, depression, hypercholesterolemia, chronic neck and back pain    Examination-Activity Limitations Locomotion Level;Bed Mobility;Sit;Squat;Stairs;Stand;Lift;Carry;Transfers;Dressing    Examination-Participation Restrictions Other    Stability/Clinical Decision Making Evolving/Moderate complexity    Rehab Potential Fair    PT Frequency 2x / week    PT Duration 8 weeks    PT Treatment/Interventions ADLs/Self Care Home Management;Aquatic Therapy;Cryotherapy;Electrical Stimulation;Moist Heat;Ultrasound;Therapeutic exercise;Therapeutic activities;Functional mobility training;Stair training;Gait training;Patient/family education;Manual techniques;Spinal Manipulations;Dry needling;Passive range of motion;Taping    PT Next Visit Plan Continue assessment of low back, neck  and mid back. Provide PT care as indicated.    Consulted and Agree with Plan of Care Patient             Patient will benefit from skilled therapeutic intervention in order to improve the following deficits and  impairments:  Difficulty walking, Decreased range of motion, Obesity, Decreased activity tolerance, Pain, Decreased strength, Impaired sensation  Visit Diagnosis: Muscle strain  Mid-back pain, acute  Pain of cervical spine  Decreased ROM of lumbar spine  Acute bilateral low back pain with right-sided sciatica     Problem List Patient Active Problem List   Diagnosis Date Noted   Moderate episode of recurrent major depressive disorder (HCC) 11/19/2020   Muscle strain 11/12/2020   Intertrigo 08/12/2020   Orthopnea 08/12/2020   Soft tissue mass 08/12/2020   Mixed incontinence 08/12/2020   Right knee pain 08/08/2020   S/P laparoscopic cholecystectomy August 2014 11/20/2012   Active smoker 01/04/2011   Morbid obesity (HCC) 01/04/2011    Fredderick Phenix, PT 12/18/2020, 5:40 PM  [facility] [address] [place], [place], [address] Phone: [phone]   Fax:  [phone]  Name: Vanessa Donaldson MRN: [id number] Date of Birth: 12-25-1980

## 2020-12-23 ENCOUNTER — Other Ambulatory Visit: Payer: Self-pay

## 2020-12-23 ENCOUNTER — Ambulatory Visit: Payer: Medicaid Other | Admitting: Physical Therapy

## 2020-12-23 ENCOUNTER — Encounter: Payer: Self-pay | Admitting: Physical Therapy

## 2020-12-23 DIAGNOSIS — M5386 Other specified dorsopathies, lumbar region: Secondary | ICD-10-CM

## 2020-12-23 DIAGNOSIS — T148XXA Other injury of unspecified body region, initial encounter: Secondary | ICD-10-CM

## 2020-12-23 DIAGNOSIS — M542 Cervicalgia: Secondary | ICD-10-CM

## 2020-12-23 DIAGNOSIS — M549 Dorsalgia, unspecified: Secondary | ICD-10-CM

## 2020-12-23 DIAGNOSIS — M5441 Lumbago with sciatica, right side: Secondary | ICD-10-CM

## 2020-12-23 NOTE — Therapy (Signed)
[facility] Outpatient Rehabilitation [facility] [address] [place], [place], [address] Phone: [phone]   Fax:  [phone]  Physical Therapy Treatment  Patient Details  Name: Vanessa Donaldson MRN: [id number] Date of Birth: [birth year] Referring Provider (PT): Vanessa Lagos, MD   Encounter Date: 12/23/2020   PT End of Session - 12/23/20 1314     Visit Number 4    Number of Visits 17    Date for PT Re-Evaluation 02/07/21    Authorization Type [place] MEDICAID HEALTHY BLUE    Progress Note Due on Visit 10    PT Start Time 1315   pt arrived late   PT Stop Time 1345    PT Time Calculation (min) 30 min    Activity Tolerance Patient limited by pain    Behavior During Therapy Restless             Past Medical History:  Diagnosis Date   Chlamydia    Chronic back pain    Chronic neck pain    Complication of anesthesia    perceived problem with epidural   Depression    Drug abuse (HCC)    marijuana   Gall stones    GERD (gastroesophageal reflux disease)    History of chlamydia 01/04/2011   Hypercholesterolemia    Morbid obesity (HCC)    Smoker    Thoracic spine pain 08/24/2013    Past Surgical History:  Procedure Laterality Date   BTL     CHOLECYSTECTOMY N/A 11/20/2012   Procedure: LAPAROSCOPIC CHOLECYSTECTOMY WITH INTRAOPERATIVE CHOLANGIOGRAM;  Surgeon: Vanessa Merino, MD;  Location: [facility];  Service: General;  Laterality: N/A;   RECTAL EXAM UNDER ANESTHESIA  11/20/2012   Procedure: RECTAL EXAM UNDER ANESTHESIA;  Surgeon: Vanessa Merino, MD;  Location: [facility];  Service: General;;   TUBAL LIGATION     had a baby since BTL    There were no vitals filed for this visit.   Subjective Assessment - 12/23/20 1320     Subjective Pt reports that her low back is "killing me today".  She reports that there is nothing that helps her pain.  She is using a lumbar brace today, but this is not helpful so far.  She reports no more visual disturbance since last  visit.  She rates her low back pain 10/10 R leg and R LBP today.  Her neck pain is 7/10.    Diagnostic tests CT scans have been ordered for the cervical, thoracic and lumbar regions. 11/19/20- Cervical Xray; FINDINGS:  Similar straightening of normal lordosis. No listhesis. Disc space  narrowing and endplate spurring R5-J8, C4-C5, and C5-C6. No  significant facet changes. There is C5-C6 bony neural foraminal  narrowing bilaterally. No evidence of fracture, focal bone  abnormality or bony destruction. There is no prevertebral soft  tissue edema.     IMPRESSION:  1. 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  2. Bilateral C5-C6 bony neural foraminal narrowing.    Pain Onset 1 to 4 weeks ago             [facility] Adult PT Treatment/Exercise:   Therapeutic Exercise: - nu-step L3 5m while taking subjective and planning session with patient - LTR - 20x - Hooklying bridge - 2x10 3'' hold - SLR - 10x ea  Manual therapy, concentrating on increasing extensibility of restricted tissue to reduce discomfort and improve mechanics in functional movement:   - LA distraction of R hip in neutral and IR for lumbar traction  using gait belt   Modalities   Heat, applied to lumbar spine during mat exercises     PT Short Term Goals - 12/23/20 1321       PT SHORT TERM GOAL #1   Title Pt will be Ind in an initial HEP    Status Achieved    Target Date 12/26/20      PT SHORT TERM GOAL #2   Title Pt will voice understanding of measures to assist in the reduction of pain    Status Achieved    Target Date 12/26/20               PT Long Term Goals - 12/05/20 0608       PT LONG TERM GOAL #1   Title Pt will be Ind in a final HEP to maintain achieved LOF    Status New    Target Date 02/07/21      PT LONG TERM GOAL #2   Title Increased trunk flexion ROM to full motion with the pt reaching to 1 inch above the ankles for improved functional mobilit    Baseline 5 inches below distal patella     Status New    Target Date 02/07/21      PT LONG TERM GOAL #3   Title Pt will report improved low back and R LE pain to 4/10 or less for 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    Baseline 7-10/10    Status New    Target Date 02/07/21      PT LONG TERM GOAL #4   Title Incresae R hip and knee strength to 4+/5 or greater to 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  Today we concentrated on core strengthening and pain reduction.  Pt remains highly limited by pain in low back and R leg.  She reports reduction in R LE with LAD but minimal change in low back pain.  Pt will continue to benefit from skilled physical therapy to address remaining deficits and achieve listed goals.  Continue per POC.    Personal Factors and Comorbidities Fitness;Comorbidity 1;Comorbidity 2;Comorbidity 3+    Comorbidities morbid obesiity, drug abuse, smoker, depression, hypercholesterolemia, chronic neck and back pain    Examination-Activity Limitations Locomotion Level;Bed Mobility;Sit;Squat;Stairs;Stand;Lift;Carry;Transfers;Dressing    Examination-Participation Restrictions Other    Stability/Clinical Decision Making Evolving/Moderate complexity    Rehab Potential Fair    PT Frequency 2x / week    PT Duration 8 weeks    PT Treatment/Interventions ADLs/Self Care Home Management;Aquatic Therapy;Cryotherapy;Electrical Stimulation;Moist Heat;Ultrasound;Therapeutic  exercise;Therapeutic activities;Functional mobility training;Stair training;Gait training;Patient/family education;Manual techniques;Spinal Manipulations;Dry needling;Passive range of motion;Taping    PT Next Visit Plan Continue assessment of low back, neck and mid back. Provide PT care as  indicated.    Consulted and Agree with Plan of Care Patient             Patient will benefit from skilled therapeutic intervention in order to improve the following deficits and impairments:  Difficulty walking, Decreased range of motion, Obesity, Decreased activity tolerance, Pain, Decreased strength, Impaired sensation  Visit Diagnosis: Muscle strain  Mid-back pain, acute  Pain of cervical spine  Decreased ROM of lumbar spine  Acute bilateral low back pain with right-sided sciatica     Problem List Patient Active Problem List   Diagnosis Date Noted   Moderate episode of recurrent major depressive disorder (HCC) 11/19/2020   Muscle strain 11/12/2020   Intertrigo 08/12/2020   Orthopnea 08/12/2020   Soft tissue mass 08/12/2020   Mixed incontinence 08/12/2020   Right knee pain 08/08/2020   S/P laparoscopic cholecystectomy August 2014 11/20/2012   Active smoker 01/04/2011   Morbid obesity (HCC) 01/04/2011    Fredderick Phenix, PT 12/23/2020, 1:49 PM  [facility] [address] [place], [place], [address] Phone: [phone]   Fax:  [phone]  Name: Vanessa FIGEROA MRN: [id number] Date of Birth: 07-Apr-1980

## 2020-12-24 ENCOUNTER — Other Ambulatory Visit: Payer: Self-pay | Admitting: Internal Medicine

## 2020-12-24 DIAGNOSIS — Z87898 Personal history of other specified conditions: Secondary | ICD-10-CM

## 2020-12-25 ENCOUNTER — Encounter: Payer: Medicaid Other | Admitting: Physical Therapy

## 2020-12-25 ENCOUNTER — Encounter: Payer: Medicaid Other | Admitting: Internal Medicine

## 2020-12-26 ENCOUNTER — Ambulatory Visit: Payer: Medicaid Other | Admitting: Physical Therapy

## 2020-12-26 ENCOUNTER — Ambulatory Visit: Payer: Medicaid Other | Admitting: Cardiology

## 2020-12-30 ENCOUNTER — Encounter: Payer: Medicaid Other | Admitting: Physical Therapy

## 2020-12-30 ENCOUNTER — Telehealth: Payer: Self-pay | Admitting: Physical Therapy

## 2020-12-30 ENCOUNTER — Ambulatory Visit: Payer: Medicaid Other | Attending: Internal Medicine | Admitting: Physical Therapy

## 2020-12-30 DIAGNOSIS — G8929 Other chronic pain: Secondary | ICD-10-CM | POA: Insufficient documentation

## 2020-12-30 DIAGNOSIS — M4802 Spinal stenosis, cervical region: Secondary | ICD-10-CM | POA: Insufficient documentation

## 2020-12-30 DIAGNOSIS — M5440 Lumbago with sciatica, unspecified side: Secondary | ICD-10-CM | POA: Insufficient documentation

## 2020-12-30 NOTE — Telephone Encounter (Signed)
Called and informed patient of missed visit; discussed need to d/c d/t multiple missed appts.  Let pt know she would need a new referral from MD to restart PT.

## 2020-12-31 ENCOUNTER — Ambulatory Visit (HOSPITAL_COMMUNITY)
Admission: RE | Admit: 2020-12-31 | Discharge: 2020-12-31 | Disposition: A | Discharge status: Home / Self Care | Payer: Medicaid Other | Source: Ambulatory Visit | Attending: Internal Medicine | Admitting: Internal Medicine

## 2020-12-31 ENCOUNTER — Other Ambulatory Visit: Payer: Self-pay

## 2020-12-31 ENCOUNTER — Ambulatory Visit (HOSPITAL_COMMUNITY): Payer: Medicaid Other

## 2020-12-31 DIAGNOSIS — M50322 Other cervical disc degeneration at C5-C6 level: Secondary | ICD-10-CM | POA: Diagnosis not present

## 2020-12-31 DIAGNOSIS — T148XXA Other injury of unspecified body region, initial encounter: Secondary | ICD-10-CM | POA: Insufficient documentation

## 2020-12-31 DIAGNOSIS — M545 Low back pain, unspecified: Secondary | ICD-10-CM | POA: Diagnosis not present

## 2020-12-31 DIAGNOSIS — M50321 Other cervical disc degeneration at C4-C5 level: Secondary | ICD-10-CM | POA: Diagnosis not present

## 2020-12-31 DIAGNOSIS — M5031 Other cervical disc degeneration,  high cervical region: Secondary | ICD-10-CM | POA: Diagnosis not present

## 2020-12-31 DIAGNOSIS — S199XXA Unspecified injury of neck, initial encounter: Secondary | ICD-10-CM | POA: Diagnosis not present

## 2021-01-01 ENCOUNTER — Encounter: Payer: Medicaid Other | Admitting: Physical Therapy

## 2021-01-02 ENCOUNTER — Ambulatory Visit: Payer: Medicaid Other | Admitting: Internal Medicine

## 2021-01-02 VITALS — BP 116/75 | HR 76 | Temp 98.1°F | Wt 280.5 lb

## 2021-01-02 DIAGNOSIS — B9789 Other viral agents as the cause of diseases classified elsewhere: Secondary | ICD-10-CM | POA: Diagnosis not present

## 2021-01-02 DIAGNOSIS — G8929 Other chronic pain: Secondary | ICD-10-CM

## 2021-01-02 DIAGNOSIS — M7989 Other specified soft tissue disorders: Secondary | ICD-10-CM | POA: Diagnosis not present

## 2021-01-02 DIAGNOSIS — M545 Low back pain, unspecified: Secondary | ICD-10-CM

## 2021-01-02 DIAGNOSIS — H538 Other visual disturbances: Secondary | ICD-10-CM

## 2021-01-02 DIAGNOSIS — H43399 Other vitreous opacities, unspecified eye: Secondary | ICD-10-CM

## 2021-01-02 DIAGNOSIS — J019 Acute sinusitis, unspecified: Secondary | ICD-10-CM

## 2021-01-02 DIAGNOSIS — M4802 Spinal stenosis, cervical region: Secondary | ICD-10-CM | POA: Diagnosis not present

## 2021-01-02 MED ORDER — GABAPENTIN 300 MG PO CAPS: 300.0000 mg | ORAL_CAPSULE | Freq: Three times a day (TID) | ORAL | 0 refills | Status: DC

## 2021-01-02 NOTE — Assessment & Plan Note (Signed)
Patient coming in with what appears to be acute viral illness. She complains of congestion, runny nose,cough, sore throat, and subjective fevers for about a week. She says that she is producing some mucous as well. Has not taken a home COVID test. On exam her oropharynx was erythematous but had no pharyngeal exudates. Suspect patient's illness will resolve on its own with symptom management but provided counseling that if patient's symptoms do not improve in a week or start to worsen that she should be seen again in clinic or go to urgent care for further evaluation. - continue using mucinex and tylenol as needed for symptoms - confirm resolution of symptoms at patient's follow up visit in 2 weeks

## 2021-01-02 NOTE — Patient Instructions (Addendum)
[facility]  It was a pleasure seeing you in the clinic today.   We discussed the results of your spinal imaging as well as the sinus drainage you have been experiencing.  Neck Pain/Back Pain I am increasing your gabapentin dosing to 3x daily. You are still going to be taking a relatively low dose and we can increase this further/discuss other options at your follow up appointment. I put in for your PT referral as well. We also would like you to follow up with the neurosurgeons. The imaging showed that you had a narrowing of the bones in your neck which could be pressing on some of your nerves and causing you the pain and weakness you are experiencing. We don't know whether you will need to have surgery but the neurosurgeons are the experts at dealing with the type of pain that you are experiencing. They will be able to provide you with more information at your visit.  2. Sinus Congestion I suspect that you have a virus that is causing your symptoms. You can continue to take  over the counter mucinex as well as tylenol for your symptoms. I expect that you will start to get better in the next week or so. I looked in your throat and did not see anything to suggest you might have a bacterial infection we need to treat at this time. If you don't get better in a week or so or start to get worse you can contact our clinic or go to urgent care.   3. Blurry vision I put in a referral for you to be able to see the eye doctor. I suspect the spots you were seeing in your vision are what we call "floaters" which are harmless and normal things that happen as we age. However, since you mentioned you feel like your vision is getting blurry lately you can get an eye exam to see if you might need glasses.    Please call our clinic at [phone] if you have any questions or concerns. The best time to call is Monday-Friday from 9am-4pm, but there is someone available 24/7 at the same number. If you need medication  refills, please notify your pharmacy one week in advance and they will send [place] a request.   Thank you for letting us take part in your care. We look forward to seeing you next time!

## 2021-01-02 NOTE — Progress Notes (Signed)
   CC: Back and neck pain  HPI:  [name] is a 40 y.o. PMH noted below, who presents to the [facility] with complaints of back and neck pain. To see the management of his acute and chronic conditions, please refer to the A&P note under the encounters tab.   Past Medical History:  Diagnosis Date   Chlamydia    Chronic back pain    Chronic neck pain    Complication of anesthesia    perceived problem with epidural   Depression    Drug abuse (HCC)    marijuana   Gall stones    GERD (gastroesophageal reflux disease)    History of chlamydia 01/04/2011   Hypercholesterolemia    Morbid obesity (HCC)    Smoker    Thoracic spine pain 08/24/2013   Review of Systems:  positive for numbness, muscle weakness, paraesthesias, congestion, cough, and sore throat  Physical Exam: Gen: Obese appearing woman in NAD HEENT:normocephalic atraumatic, MMM, oropharynx slightly erythematous, no pharyngeal exudates noted CV: RRR no m/r/g Resp: CTAB, normal WOB on room air GI: soft non-tender, distended Neuro:Alert oriented answering questions appropriately, 4/5 strength throughout in the RUE, 5/5 strength in the LUE MSK: normal bulk and tone, normal gait Skin:warm and dry Psych: normal affect   Assessment & Plan:   See Encounters Tab for problem based charting.  Patient seen with Dr. Heide Spark

## 2021-01-02 NOTE — Assessment & Plan Note (Signed)
Patient describes small specks traveling across her vision that appear like small pieces of lint. Nobody else is able to see these spots. She also endorses some blurry vision especially at night. Suspect these are related to floaters but will get patient in to see the ophthalmologist - ophthalmology referral
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
## 2021-01-02 NOTE — Assessment & Plan Note (Signed)
Patient has scheduled appointment for a mammogram and an ultrasound on the 2nd of Nov. - f/u imaging results

## 2021-01-02 NOTE — Assessment & Plan Note (Signed)
Patient discussed making dietary changes to try and lose weight. She thinks that losing some weight will help with her back pain. Patient is down 10 lbs since august visit - continue lifestyle modification

## 2021-01-05 NOTE — Progress Notes (Signed)
Internal Medicine Clinic Attending ° °Case discussed with Dr. DeMaio  At the time of the visit.  We reviewed the resident’s history and exam and pertinent patient test results.  I agree with the assessment, diagnosis, and plan of care documented in the resident’s note. ° ° °

## 2021-01-06 ENCOUNTER — Encounter: Payer: Medicaid Other | Admitting: Physical Therapy

## 2021-01-08 ENCOUNTER — Encounter: Payer: Medicaid Other | Admitting: Physical Therapy

## 2021-01-13 ENCOUNTER — Encounter: Payer: Medicaid Other | Admitting: Physical Therapy

## 2021-01-15 ENCOUNTER — Encounter: Payer: Medicaid Other | Admitting: Physical Therapy

## 2021-01-20 ENCOUNTER — Encounter: Payer: Medicaid Other | Admitting: Physical Therapy

## 2021-01-21 ENCOUNTER — Ambulatory Visit (HOSPITAL_COMMUNITY)
Admission: EM | Admit: 2021-01-21 | Discharge: 2021-01-21 | Discharge status: Against Medical Advice | Payer: Medicaid Other

## 2021-01-21 ENCOUNTER — Ambulatory Visit: Payer: Medicaid Other

## 2021-01-21 ENCOUNTER — Other Ambulatory Visit: Payer: Self-pay

## 2021-01-21 DIAGNOSIS — G8929 Other chronic pain: Secondary | ICD-10-CM | POA: Diagnosis not present

## 2021-01-21 DIAGNOSIS — M5440 Lumbago with sciatica, unspecified side: Secondary | ICD-10-CM | POA: Diagnosis not present

## 2021-01-21 DIAGNOSIS — M4802 Spinal stenosis, cervical region: Secondary | ICD-10-CM | POA: Diagnosis not present

## 2021-01-22 ENCOUNTER — Ambulatory Visit: Payer: Medicaid Other | Admitting: Cardiology

## 2021-01-22 NOTE — Therapy (Signed)
[facility] Outpatient Rehabilitation [facility] [address] [place], [place], [address] Phone: [phone]   Fax:  [phone]  Physical Therapy Evaluation  Patient Details  Name: SAKEENA TEALL MRN: [id number] Date of Birth: [birth year] Referring Provider (PT): Earl Lagos, MD   Encounter Date: 01/21/2021   PT End of Session - 01/22/21 2226     Visit Number 1    Number of Visits 9    Date for PT Re-Evaluation 03/28/21    Authorization Type [place] MEDICAID HEALTHY BLUE    Progress Note Due on Visit 10    PT Start Time 1500    PT Stop Time 1545    PT Time Calculation (min) 45 min    Activity Tolerance Patient limited by pain    Behavior During Therapy Restless             Past Medical History:  Diagnosis Date   Chlamydia    Chronic back pain    Chronic neck pain    Complication of anesthesia    perceived problem with epidural   Depression    Drug abuse (HCC)    marijuana   Gall stones    GERD (gastroesophageal reflux disease)    History of chlamydia 01/04/2011   Hypercholesterolemia    Morbid obesity (HCC)    Smoker    Thoracic spine pain 08/24/2013    Past Surgical History:  Procedure Laterality Date   BTL     CHOLECYSTECTOMY N/A 11/20/2012   Procedure: LAPAROSCOPIC CHOLECYSTECTOMY WITH INTRAOPERATIVE CHOLANGIOGRAM;  Surgeon: Valarie Merino, MD;  Location: [facility];  Service: General;  Laterality: N/A;   RECTAL EXAM UNDER ANESTHESIA  11/20/2012   Procedure: RECTAL EXAM UNDER ANESTHESIA;  Surgeon: Valarie Merino, MD;  Location: [facility];  Service: General;;   TUBAL LIGATION     had a baby since BTL    There were no vitals filed for this visit.    Subjective Assessment - 01/22/21 2328     Subjective Pt reports the pain in the center of her low back is wrose. Her R upper shoulder pain is better. Pt reports being injuried with neck and low back pain when par of a ceiling fell on her head    How long can you sit comfortably? 5 mins. Not able  to get comfortable changing positions frequently. Dauther helps her dress.    How long can you stand comfortably? 5-10 mins    How long can you walk comfortably? a couple mins    Diagnostic tests CT scans have been ordered for the cervical, thoracic and lumbar regions. 11/19/20- Cervical Xray; FINDINGS:  Similar straightening of normal lordosis. No listhesis. Disc space  narrowing and endplate spurring Q1-J9, C4-C5, and C5-C6. No  significant facet changes. There is C5-C6 bony neural foraminal  narrowing bilaterally. No evidence of fracture, focal bone  abnormality or bony destruction. There is no prevertebral soft  tissue edema.     IMPRESSION:  1. 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  2. Bilateral C5-C6 bony neural foraminal narrowing. Lmbar IMPRESSION:  1. No acute osseous abnormality in the lumbar spine, and no age  advanced lumbar spine degeneration except at L5-S1 - where facet  degeneration is moderate and chronic. Suspect mild associated  bilateral L5 neural foraminal stenosis.     2. Chronically advanced right hip joint degeneration may have  progressed since 2018.    Patient Stated Goals "For you to fix all this so I can get going"  Currently in Pain? Yes    Pain Score 8     Pain Location Back    Pain Orientation Right;Left;Mid;Lower    Pain Descriptors / Indicators Aching    Pain Type Chronic pain    Pain Radiating Towards whole R leg and foot    Pain Onset More than a month ago    Pain Frequency Constant    Aggravating Factors  Standing-crushing feeling of the spine    Pain Relieving Factors Pain medication, muscle relaxors, hot and cold packs. All provide only temporary relief.    Pain Score 7    Pain Location Neck    Pain Orientation Right;Left    Pain Descriptors / Indicators Aching    Pain Type Chronic pain    Pain Radiating Towards whole R arm and hand    Pain Onset More than a month ago    Pain Frequency Constant    Aggravating Factors  Lying on it a certain  way. Sudden movements.    Pain Relieving Factors Heating pad or cold pack                [facility] PT Assessment - 01/22/21 0001       Assessment   Medical Diagnosis Cervical stenosis of spine.  Chronic low back pain, unspecified back pain laterality, unspecified whether sciatica present    Referring Provider (PT) Earl Lagos, MD    Onset Date/Surgical Date 11/08/20    Hand Dominance Right    Prior Therapy No PT; went to 1 chiropractor visit but has but has not returned      Precautions   Precautions None      Restrictions   Weight Bearing Restrictions No      Balance Screen   Has the patient fallen in the past 6 months No      Home Environment   Living Environment Private residence    Living Arrangements Children    Type of Home House    Home Access Stairs to enter    Entrance Stairs-Number of Steps 3    Entrance Stairs-Rails None    Home Layout One level      Prior Function   Level of Independence Independent    Vocation Full time employment   out due to injury   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, standing, sitting    Leisure Walking      Cognition   Overall Cognitive Status Within Functional Limits for tasks assessed      Observation/Other Assessments   Focus on Therapeutic Outcomes (FOTO)  NA-MCD      Sensation   Light Touch --   not reassessed     Posture/Postural Control   Posture/Postural Control Postural limitations    Postural Limitations Forward head;Decreased thoracic kyphosis      Deep Tendon Reflexes   DTR Assessment Site Patella;Achilles    Patella DTR 1+   equal bilat   Achilles DTR 1+   equal bilat     ROM / Strength   AROM / PROM / Strength AROM      AROM   AROM Assessment Site Lumbar    Lumbar Flexion Full, provoked low back and R leg pain    Lumbar Extension Full, provoked low back and R leg pain    Lumbar - Right Side Bend Full    Lumbar - Left Side Bend Full; provoked low back    Lumbar -  Right Rotation Full    Lumbar - Left  Rotation Full, provoke low back pain      Strength   Overall Strength Comments Not reassessed      Flexibility   Soft Tissue Assessment /Muscle Length yes    Hamstrings 80d bilat      Bed Mobility   Bed Mobility Rolling Right;Rolling Left;Supine to Sit    Rolling Right Independent   Pt completes with good quality     Transfers   Transfers Sit to Stand;Stand to Sit    Sit to Stand 6: Modified independent (Device/Increase time)   Pt completes with good quality     Ambulation/Gait   Ambulation/Gait Yes    Ambulation/Gait Assistance 7: Independent    Gait Pattern Step-through pattern   Pt completes with good quality                       Objective measurements completed on examination: See above findings.                PT Education - 01/22/21 2224     Education Details Eval findings, POC, HEP to initiate lumbosacral mobility and strengthening    Person(s) Educated Patient    Methods Explanation;Demonstration;Tactile cues;Verbal cues;Handout    Comprehension Verbalized understanding;Returned demonstration;Verbal cues required;Tactile cues required              PT Short Term Goals - 01/22/21 2231       PT SHORT TERM GOAL #1   Title Pt will be Ind in an initial HEP    Status Achieved    Target Date 02/12/21      PT SHORT TERM GOAL #2   Title Pt will voice understanding of measures to assist in the reduction of pain    Status Achieved    Target Date 02/12/21               PT Long Term Goals - 01/22/21 2233       PT LONG TERM GOAL #1   Title Pt will be Ind in a final HEP to maintain achieved LOF    Target Date 03/28/21      PT LONG TERM GOAL #2   Title Increased trunk flexion ROM to full motion with the pt reaching to 1 inch above the ankles for improved functional mobility    Baseline Demonstrated trunk ROMs WNLs on eval 01/21/21    Status Achieved    Target Date 01/21/21      PT LONG  TERM GOAL #3   Title Pt will report improved low back and R LE pain to 4/10 or less for 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    Baseline 7-10/10    Status New    Target Date 03/28/21      PT LONG TERM GOAL #4   Title Incresae R hip and knee strength to 4+/5 or greater to 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 Today a re-eval was completed for the low back per pt's request. Eval revealed trunk ROMs to be WNLs, but with provoked pain for flex, ext, L side bending, and R rotation. Pt demonstrating good quality with functional movements with sit to/from supine, sit t/from standing and walking. Lumbopelvic mobility and strengthening exs for LTR and marching were started with pt to complete as tolerated. Ther ex for bridging, supine clams, and SLRs were not tolerated with pt reporting provocation of the R low back and R LE with these exs. Pt may benefit from PT 1w8 to assist with the reduction of pain and improving function and tolerance to activity. Will assess pt's neck the session as per pt's request.    Personal Factors and Comorbidities Fitness;Comorbidity 1;Comorbidity 2;Comorbidity 3+    Comorbidities morbid obesiity, drug abuse, smoker, depression, hypercholesterolemia, chronic neck and back pain    Examination-Activity Limitations Locomotion Level;Bed Mobility;Sit;Squat;Stairs;Stand;Lift;Carry;Transfers;Dressing    Examination-Participation  Restrictions Other    Stability/Clinical Decision Making Evolving/Moderate complexity    Clinical Decision Making Moderate    Rehab Potential Fair    PT Frequency 2x / week    PT Duration 8 weeks    PT Treatment/Interventions ADLs/Self Care Home Management;Aquatic Therapy;Cryotherapy;Electrical Stimulation;Moist Heat;Ultrasound;Therapeutic exercise;Therapeutic activities;Functional mobility training;Stair training;Gait training;Patient/family education;Manual techniques;Spinal Manipulations;Dry needling;Passive range of motion;Taping    PT Next Visit Plan Continue assessment of low back, neck and mid back. Provide PT care as indicated.    PT Home Exercise Plan [id number]    Consulted and Agree with Plan of Care Patient             Patient will benefit from skilled therapeutic intervention in order to improve the following deficits and impairments:  Difficulty walking, Decreased range of motion, Obesity, Decreased activity tolerance, Pain, Decreased strength, Impaired sensation  Visit Diagnosis: Chronic low back pain with sciatica, sciatica laterality unspecified, unspecified back pain laterality  Cervical stenosis of spine     Problem List Patient Active Problem List   Diagnosis Date Noted   Cervical spinal stenosis 01/02/2021   Floaters in visual field 01/02/2021   Acute viral sinusitis 01/02/2021   Moderate episode of recurrent major depressive disorder (HCC) 11/19/2020   Muscle strain 11/12/2020   Intertrigo 08/12/2020   Orthopnea 08/12/2020   Soft tissue mass 08/12/2020   Mixed incontinence 08/12/2020   Right knee pain 08/08/2020   S/P laparoscopic cholecystectomy August 2014 11/20/2012   Active smoker 01/04/2011   Morbid obesity (HCC) 01/04/2011    Joellyn Rued MS, PT 01/22/21 11:32 PM   [facility] [address] [place], [place], [address] Phone: [phone]   Fax:  [phone]  Name: LOVETTE MERTA MRN:  [id number] Date of Birth: 1980-06-15  Check all possible CPT codes: 97110- Therapeutic Exercise, [phone]- Neuro Re-education, [phone] - Gait Training, [phone] - Manual Therapy, 97530 - Therapeutic Activities, 97535 - Self Care, [phone] - Electrical stimulation (Manual), and [id number] - Ultrasound

## 2021-01-22 NOTE — Therapy (Signed)
[facility] Outpatient Rehabilitation [facility] [address] [place], [place], [address] Phone: [phone]   Fax:  [phone]  Physical Therapy Evaluation  Patient Details  Name: Vanessa Donaldson MRN: [id number] Date of Birth: 1981/02/16 Referring Provider (PT): Earl Lagos, MD   Encounter Date: 01/21/2021   PT End of Session - 01/22/21 2226     Visit Number 1    Number of Visits 9    Date for PT Re-Evaluation 03/28/21    Authorization Type [place] MEDICAID HEALTHY BLUE    Progress Note Due on Visit 10    PT Start Time 1500    PT Stop Time 1545    PT Time Calculation (min) 45 min    Activity Tolerance Patient limited by pain    Behavior During Therapy Restless             Past Medical History:  Diagnosis Date   Chlamydia    Chronic back pain    Chronic neck pain    Complication of anesthesia    perceived problem with epidural   Depression    Drug abuse (HCC)    marijuana   Gall stones    GERD (gastroesophageal reflux disease)    History of chlamydia 01/04/2011   Hypercholesterolemia    Morbid obesity (HCC)    Smoker    Thoracic spine pain 08/24/2013    Past Surgical History:  Procedure Laterality Date   BTL     CHOLECYSTECTOMY N/A 11/20/2012   Procedure: LAPAROSCOPIC CHOLECYSTECTOMY WITH INTRAOPERATIVE CHOLANGIOGRAM;  Surgeon: Valarie Merino, MD;  Location: [facility];  Service: General;  Laterality: N/A;   RECTAL EXAM UNDER ANESTHESIA  11/20/2012   Procedure: RECTAL EXAM UNDER ANESTHESIA;  Surgeon: Valarie Merino, MD;  Location: [facility];  Service: General;;   TUBAL LIGATION     had a baby since BTL    There were no vitals filed for this visit.    Subjective Assessment - 01/21/21 1514     Subjective Pt reports the pain in the center of her low back is wrose. Her R upper shoulder pain is better. Pt reports being injuried with neck and low back pain when par of a ceiling fell on her head    How long can you sit comfortably? 5 mins. Not able  to get comfortable changing positions frequently. Dauther helps her dress.    How long can you stand comfortably? 5-10 mins    How long can you walk comfortably? a couple mins    Diagnostic tests CT scans have been ordered for the cervical, thoracic and lumbar regions. 11/19/20- Cervical Xray; FINDINGS:  Similar straightening of normal lordosis. No listhesis. Disc space  narrowing and endplate spurring K9-T2, C4-C5, and C5-C6. No  significant facet changes. There is C5-C6 bony neural foraminal  narrowing bilaterally. No evidence of fracture, focal bone  abnormality or bony destruction. There is no prevertebral soft  tissue edema.     IMPRESSION:  1. 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  2. Bilateral C5-C6 bony neural foraminal narrowing.    Patient Stated Goals "For you to fix all this so I can get going"    Currently in Pain? Yes    Pain Score 8     Pain Location Back    Pain Orientation Right;Left;Mid;Lower    Pain Descriptors / Indicators Aching    Pain Type Chronic pain    Pain Radiating Towards whole R leg and foot    Pain Onset More than  a month ago    Pain Frequency Constant    Aggravating Factors  Standing-crushing feeling of the spine    Pain Relieving Factors Pain medication, muscle relaxors, hot and cold packs. All provide only temporary relief.    Pain Score 7    Pain Location Neck    Pain Orientation Right;Left    Pain Descriptors / Indicators Aching    Pain Type Chronic pain    Pain Radiating Towards whole R arm and hand    Pain Onset More than a month ago    Pain Frequency Constant    Aggravating Factors  Lying on it a certain way. Sudden movements.    Pain Relieving Factors Heating pad or cold pack                            Objective measurements completed on examination: See above findings.                PT Education - 01/22/21 2224     Education Details Eval findings, POC, HEP to initiate lumbosacral mobility and  strengthening    Person(s) Educated Patient    Methods Explanation;Demonstration;Tactile cues;Verbal cues;Handout    Comprehension Verbalized understanding;Returned demonstration;Verbal cues required;Tactile cues required              PT Short Term Goals - 01/22/21 2231       PT SHORT TERM GOAL #1   Title Pt will be Ind in an initial HEP    Status Achieved    Target Date 02/12/21      PT SHORT TERM GOAL #2   Title Pt will voice understanding of measures to assist in the reduction of pain    Status Achieved    Target Date 02/12/21               PT Long Term Goals - 01/22/21 2233       PT LONG TERM GOAL #1   Title Pt will be Ind in a final HEP to maintain achieved LOF    Target Date 03/28/21      PT LONG TERM GOAL #2   Title Increased trunk flexion ROM to full motion with the pt reaching to 1 inch above the ankles for improved functional mobility    Baseline Demonstrated trunk ROMs WNLs on eval 01/21/21    Status Achieved    Target Date 01/21/21      PT LONG TERM GOAL #3   Title Pt will report improved low back and R LE pain to 4/10 or less for 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    Baseline 7-10/10    Status New    Target Date 03/28/21      PT LONG TERM GOAL #4   Title Incresae R hip and knee strength to 4+/5 or greater to 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    Baseline 4/5, see flow sheets    Status New    Target Date 03/28/21      PT LONG TERM GOAL #5   Title As indicated complete assessment of functional abilites and set goals, ie 5xSTS and 2 or 6 min walking test    Status New    Target Date 03/28/21                    Plan - 01/22/21 2227  Personal Factors and Comorbidities Fitness;Comorbidity 1;Comorbidity 2;Comorbidity 3+    Comorbidities morbid obesiity, drug abuse, smoker, depression, hypercholesterolemia, chronic neck and back pain     Examination-Activity Limitations Locomotion Level;Bed Mobility;Sit;Squat;Stairs;Stand;Lift;Carry;Transfers;Dressing    Examination-Participation Restrictions Other    Stability/Clinical Decision Making Evolving/Moderate complexity    Clinical Decision Making Moderate    Rehab Potential Fair    PT Frequency 2x / week    PT Duration 8 weeks    PT Treatment/Interventions ADLs/Self Care Home Management;Aquatic Therapy;Cryotherapy;Electrical Stimulation;Moist Heat;Ultrasound;Therapeutic exercise;Therapeutic activities;Functional mobility training;Stair training;Gait training;Patient/family education;Manual techniques;Spinal Manipulations;Dry needling;Passive range of motion;Taping    PT Next Visit Plan Continue assessment of low back, neck and mid back. Provide PT care as indicated.    PT Home Exercise Plan [id number]    Consulted and Agree with Plan of Care Patient             Patient will benefit from skilled therapeutic intervention in order to improve the following deficits and impairments:  Difficulty walking, Decreased range of motion, Obesity, Decreased activity tolerance, Pain, Decreased strength, Impaired sensation  Visit Diagnosis: Chronic low back pain with sciatica, sciatica laterality unspecified, unspecified back pain laterality  Cervical stenosis of spine     Problem List Patient Active Problem List   Diagnosis Date Noted   Cervical spinal stenosis 01/02/2021   Floaters in visual field 01/02/2021   Acute viral sinusitis 01/02/2021   Moderate episode of recurrent major depressive disorder (HCC) 11/19/2020   Muscle strain 11/12/2020   Intertrigo 08/12/2020   Orthopnea 08/12/2020   Soft tissue mass 08/12/2020   Mixed incontinence 08/12/2020   Right knee pain 08/08/2020   S/P laparoscopic cholecystectomy August 2014 11/20/2012   Active smoker 01/04/2011   Morbid obesity (HCC) 01/04/2011    Joellyn Rued, PT 01/22/2021, 10:44 PM  [facility] Out[facility]  Rehabilitation [facility] [address] [place], [place], [address] Phone: [phone]   Fax:  [phone]  Name: DELINA KRUCZEK MRN: [id number] Date of Birth: 04/06/1980

## 2021-01-23 ENCOUNTER — Other Ambulatory Visit: Payer: Self-pay | Admitting: Internal Medicine

## 2021-01-23 DIAGNOSIS — N6310 Unspecified lump in the right breast, unspecified quadrant: Secondary | ICD-10-CM

## 2021-01-28 ENCOUNTER — Other Ambulatory Visit: Payer: Medicaid Other

## 2021-01-29 ENCOUNTER — Ambulatory Visit: Payer: Medicaid Other | Attending: Internal Medicine

## 2021-01-29 ENCOUNTER — Ambulatory Visit
Admission: RE | Admit: 2021-01-29 | Discharge: 2021-01-29 | Disposition: A | Discharge status: Home / Self Care | Payer: Medicaid Other | Source: Ambulatory Visit | Attending: Internal Medicine | Admitting: Internal Medicine

## 2021-01-29 ENCOUNTER — Other Ambulatory Visit: Payer: Self-pay

## 2021-01-29 DIAGNOSIS — G8929 Other chronic pain: Secondary | ICD-10-CM | POA: Diagnosis present

## 2021-01-29 DIAGNOSIS — M542 Cervicalgia: Secondary | ICD-10-CM | POA: Diagnosis present

## 2021-01-29 DIAGNOSIS — M5440 Lumbago with sciatica, unspecified side: Secondary | ICD-10-CM | POA: Insufficient documentation

## 2021-01-29 DIAGNOSIS — M4802 Spinal stenosis, cervical region: Secondary | ICD-10-CM | POA: Insufficient documentation

## 2021-01-29 DIAGNOSIS — M549 Dorsalgia, unspecified: Secondary | ICD-10-CM | POA: Diagnosis present

## 2021-01-29 DIAGNOSIS — M5386 Other specified dorsopathies, lumbar region: Secondary | ICD-10-CM | POA: Diagnosis present

## 2021-01-29 DIAGNOSIS — N6310 Unspecified lump in the right breast, unspecified quadrant: Secondary | ICD-10-CM

## 2021-01-29 DIAGNOSIS — T148XXA Other injury of unspecified body region, initial encounter: Secondary | ICD-10-CM | POA: Insufficient documentation

## 2021-01-29 DIAGNOSIS — R922 Inconclusive mammogram: Secondary | ICD-10-CM | POA: Diagnosis not present

## 2021-01-29 DIAGNOSIS — R29898 Other symptoms and signs involving the musculoskeletal system: Secondary | ICD-10-CM | POA: Diagnosis present

## 2021-01-29 DIAGNOSIS — M5441 Lumbago with sciatica, right side: Secondary | ICD-10-CM | POA: Insufficient documentation

## 2021-01-29 NOTE — Therapy (Addendum)
[place], [place], [address] Phone: [phone]   Fax:  [phone]  Physical Therapy Treatment/Discharge  Patient Details  Name: Vanessa Donaldson MRN: [id number] Date of Birth: 04/21/1980 Referring Provider (PT): Aldine Contes, MD   Encounter Date: 01/29/2021   PT End of Session - 01/29/21 2319     Visit Number 2    Number of Visits 9    Date for PT Re-Evaluation 03/28/21    Authorization Type [employer] MEDICAID HEALTHY BLUE    PT Start Time 9476    PT Stop Time 1550    PT Time Calculation (min) 45 min    Activity Tolerance Patient limited by pain    Behavior During Therapy Restless             Past Medical History:  Diagnosis Date   Chlamydia    Chronic back pain    Chronic neck pain    Complication of anesthesia    perceived problem with epidural   Depression    Drug abuse ([place])    marijuana   Gall stones    GERD (gastroesophageal reflux disease)    History of chlamydia 01/04/2011   Hypercholesterolemia    Morbid obesity ([place])    Smoker    Thoracic spine pain 08/24/2013    Past Surgical History:  Procedure Laterality Date   BTL     CHOLECYSTECTOMY N/A 11/20/2012   Procedure: LAPAROSCOPIC CHOLECYSTECTOMY WITH INTRAOPERATIVE CHOLANGIOGRAM;  Surgeon: Pedro Earls, MD;  Location: [facility];  Service: General;  Laterality: N/A;   RECTAL EXAM UNDER ANESTHESIA  11/20/2012   Procedure: RECTAL EXAM UNDER ANESTHESIA;  Surgeon: Pedro Earls, MD;  Location: [facility];  Service: General;;   TUBAL LIGATION     had a baby since BTL    There were no vitals filed for this visit.   Subjective Assessment - 01/29/21 1512     Subjective The pain is still there and won't go away. The pain travels, but will not leave my body. My cold is better    Diagnostic tests CT scans have been ordered for the cervical, thoracic and lumbar regions. 11/19/20- Cervical Xray; FINDINGS:  Similar straightening of normal  lordosis. No listhesis. Disc space  narrowing and endplate spurring L4-Y5, C4-C5, and C5-C6. No  significant facet changes. There is C5-C6 bony neural foraminal  narrowing bilaterally. No evidence of fracture, focal bone  abnormality or bony destruction. There is no prevertebral soft  tissue edema.     IMPRESSION:  1. 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  2. Bilateral C5-C6 bony neural foraminal narrowing. [facility]mbar IMPRESSION:  1. No acute osseous abnormality in the lumbar spine, and no age  advanced lumbar spine degeneration except at L5-S1 - where facet  degeneration is moderate and chronic. Suspect mild associated  bilateral L5 neural foraminal stenosis.     2. Chronically advanced right hip joint degeneration may have  progressed since 2018.    Patient Stated Goals "For you to fix all this so I can get going"    Currently in Pain? Yes    Pain Score 7     Pain Location Back    Pain Orientation Mid;Lower    Pain Descriptors / Indicators Aching    Pain Type Chronic pain    Pain Onset More than a month ago    Pain Frequency Constant    Pain Score 10    Pain Location Neck    Pain Orientation  Right;Left    Pain Descriptors / Indicators Aching    Pain Type Chronic pain    Pain Frequency Constant    Aggravating Factors  It hurts all the time, nothing helps                [facility] PT Assessment - 01/29/21 0001       ROM / Strength   AROM / PROM / Strength AROM;Strength      AROM   AROM Assessment Site Cervical    Cervical Flexion 40d, pulling posterior neck    Cervical Extension 60d, no pain    Cervical - Right Side Bend 30d, relief    Cervical - Left Side Bend 25d, r upper shoulder pain    Cervical - Right Rotation 70d, R upper shoulder pain    Cervical - Left Rotation 68d, R upper shoulder pain      Strength   Overall Strength Comments Grip: R: 18psi, 0psi=9psi; L; 7 psi, 30 psi= 18.5psi            [facility] Adult PT Treatment/Exercise: -NuStep 7 mins, L4, Ues/Les.  Pt completed wih good quality of movement -Sitting, Cervical retraction, 10 x 3"  -L upper trap stretch, 3 x 15" -L levator scapulae stretch, 3 x 15' - Bilat shlder ER, x 15, RTB. Pt reported an increase in upper/mid back pain   Therapeutic Exercise: -   Manual Therapy: -   Neuromuscular re-ed: -   Therapeutic Activity: -                            PT Education - 01/29/21 2319     Education Details Updated HEP for cervical ROM and stretching    Person(s) Educated Patient    Methods Explanation;Demonstration;Tactile cues;Verbal cues    Comprehension Verbalized understanding;Returned demonstration;Verbal cues required;Tactile cues required;Need further instruction              PT Short Term Goals - 01/22/21 2231       PT SHORT TERM GOAL #1   Title Pt will be Ind in an initial HEP    Status Achieved    Target Date 02/12/21      PT SHORT TERM GOAL #2   Title Pt will voice understanding of measures to assist in the reduction of pain    Status Achieved    Target Date 02/12/21               PT Long Term Goals - 01/29/21 2359       PT LONG TERM GOAL #6   Title Pt will reports a decreas in cervicla upper shoulder and mid back pain to 4/10 or less form improved function and quality of life    Status New    Target Date 03/28/21      PT LONG TERM GOAL #7   Title Improve pt's cervical side bending ROM to to 38d bilat for improved neck function    Status New    Target Date 03/28/21                   Plan - 01/29/21 2321     Clinical Impression Statement Completed cervical assessment. Cervical AROM was found to be limited with R side bending the most limited and provoking pain. With L side bending, pt reported her pain being decreased. Grip strength testing found decreased strength bilat, but with 2 tests for each hand significantly different from the  other. Pt was started on ther ex for ROM and stretching exs to decrease R  cervical compressive, stretching of the R upper shoulder musculature, and for posterior chain strengthening. Pt reported pain with bilat shlder ER and this ex was Dced.Pt reported decreased pain c cervical retraction. PT for the neck will be incorporated into the pt's sessions. Continued skilled PT to address deficits and pain to optimize function.    Personal Factors and Comorbidities Fitness;Comorbidity 1;Comorbidity 2;Comorbidity 3+    Comorbidities morbid obesiity, drug abuse, smoker, depression, hypercholesterolemia, chronic neck and back pain    Examination-Activity Limitations Locomotion Level;Bed Mobility;Sit;Squat;Stairs;Stand;Lift;Carry;Transfers;Dressing    Examination-Participation Restrictions Other    Stability/Clinical Decision Making Evolving/Moderate complexity    Clinical Decision Making Moderate    Rehab Potential Fair    PT Frequency 2x / week    PT Duration 8 weeks    PT Treatment/Interventions ADLs/Self Care Home Management;Aquatic Therapy;Cryotherapy;Electrical Stimulation;Moist Heat;Ultrasound;Therapeutic exercise;Therapeutic activities;Functional mobility training;Stair training;Gait training;Patient/family education;Manual techniques;Spinal Manipulations;Dry needling;Passive range of motion;Taping    PT Next Visit Plan Continue assessment of low back, neck and mid back. Assess response to ther ex /HEP    PT [place] and Agree with Plan of Care Patient             Patient will benefit from skilled therapeutic intervention in order to improve the following deficits and impairments:  Difficulty walking, Decreased range of motion, Obesity, Decreased activity tolerance, Pain, Decreased strength, Impaired sensation  Visit Diagnosis: Chronic low back pain with sciatica, sciatica laterality unspecified, unspecified back pain laterality  Cervical stenosis of spine  Muscle strain  Mid-back pain, acute  Pain of cervical spine  Decreased  ROM of lumbar spine  Acute bilateral low back pain with right-sided sciatica  Decreased ROM of neck     Problem List Patient Active Problem List   Diagnosis Date Noted   Cervical spinal stenosis 01/02/2021   Floaters in visual field 01/02/2021   Acute viral sinusitis 01/02/2021   Moderate episode of recurrent major depressive disorder ([place]) 11/19/2020   Muscle strain 11/12/2020   Intertrigo 08/12/2020   Orthopnea 08/12/2020   Soft tissue mass 08/12/2020   Mixed incontinence 08/12/2020   Right knee pain 08/08/2020   S/P laparoscopic cholecystectomy August 2014 11/20/2012   Active smoker 01/04/2011   Morbid obesity ([place]) 01/04/2011   Ferrell Claiborne MS, PT 01/30/21 12:07 AM  PHYSICAL THERAPY DISCHARGE SUMMARY  Visits from Start of Care: 2  Current functional level related to goals / functional outcomes: Unknown, not returning since the last visit.   Remaining deficits: Unknown, not returning since the last visit.   Education / Equipment: HEP, Pt Ed   Patient agrees to discharge. Patient goals were not met. Patient is being discharged due to not returning since the last visit.  Gar Ponto MS, PT 03/28/21 9:45 PM    [place] [facility] [address] [place], [place], [address] Phone: [phone]   Fax:  [phone]  Name: Vanessa Donaldson MRN: [id number] Date of Birth: 1980-09-23

## 2021-02-10 ENCOUNTER — Ambulatory Visit: Payer: Medicaid Other | Admitting: Internal Medicine

## 2021-02-10 ENCOUNTER — Ambulatory Visit: Payer: Medicaid Other

## 2021-02-10 ENCOUNTER — Encounter: Payer: Self-pay | Admitting: Internal Medicine

## 2021-02-10 ENCOUNTER — Ambulatory Visit (HOSPITAL_COMMUNITY)
Admission: RE | Admit: 2021-02-10 | Discharge: 2021-02-10 | Disposition: A | Discharge status: Home / Self Care | Payer: Medicaid Other | Source: Ambulatory Visit | Attending: Internal Medicine | Admitting: Internal Medicine

## 2021-02-10 ENCOUNTER — Other Ambulatory Visit: Payer: Self-pay

## 2021-02-10 VITALS — BP 313/82 | HR 71 | Temp 98.3°F | Ht 62.0 in | Wt 284.5 lb

## 2021-02-10 DIAGNOSIS — M25561 Pain in right knee: Secondary | ICD-10-CM

## 2021-02-10 DIAGNOSIS — M4802 Spinal stenosis, cervical region: Secondary | ICD-10-CM

## 2021-02-10 DIAGNOSIS — G8929 Other chronic pain: Secondary | ICD-10-CM

## 2021-02-10 DIAGNOSIS — T148XXA Other injury of unspecified body region, initial encounter: Secondary | ICD-10-CM

## 2021-02-10 DIAGNOSIS — M5440 Lumbago with sciatica, unspecified side: Secondary | ICD-10-CM | POA: Diagnosis not present

## 2021-02-10 MED ORDER — TOPIRAMATE 25 MG PO TABS: 25.0000 mg | ORAL_TABLET | Freq: Every day | ORAL | 0 refills | Status: DC

## 2021-02-10 MED ORDER — PHENTERMINE HCL 30 MG PO CAPS: 30.0000 mg | ORAL_CAPSULE | ORAL | 0 refills | Status: AC

## 2021-02-10 MED ORDER — GABAPENTIN 600 MG PO TABS: 600.0000 mg | ORAL_TABLET | Freq: Three times a day (TID) | ORAL | 0 refills | Status: DC

## 2021-02-10 MED ORDER — CYCLOBENZAPRINE HCL 5 MG PO TABS: 5.0000 mg | ORAL_TABLET | Freq: Three times a day (TID) | ORAL | 0 refills | Status: DC | PRN

## 2021-02-10 MED ORDER — IBUPROFEN 800 MG PO TABS: 800.0000 mg | ORAL_TABLET | Freq: Three times a day (TID) | ORAL | 0 refills | Status: DC | PRN

## 2021-02-10 NOTE — Assessment & Plan Note (Addendum)
Ms. Danielsen states that she continues to have right knee pain that is severe and life quality limiting.  She endorses buckling of her knee quite frequently.  At a previous visit, a x-ray was ordered but not completed yet.  Assessment/plan: Differential includes osteoarthritis given morbid obesity, however previous concern for meniscal injury.  We will start with an x-ray and proceed further as needed.  - Right knee x-ray completed with results pending - Tylenol and ibuprofen for pain - Discussed consideration of steroid injection; patient will reschedule for this if she is interested  ADDENDUM:  Xray shows developing osteoarthritis with osteophytes. Discussed with patient, who endorses locking of her right knee. Due to previous concern of meniscal injury and lack of improvement over the last several months, will go ahead and obtain MRI. Strongly recommend she start PT for her knee as well.

## 2021-02-10 NOTE — Progress Notes (Signed)
   CC: cervical stenosis follow up; right knee pain  HPI:  Vanessa Donaldson is a 40 y.o. with a PMHx as listed below who presents to the clinic for cervical stenosis follow up; right knee pain.   Please see the Encounters tab for problem-based Assessment & Plan regarding status of patient's acute and chronic conditions.  Past Medical History:  Diagnosis Date   Chlamydia    Chronic back pain    Chronic neck pain    Complication of anesthesia    perceived problem with epidural   Depression    Drug abuse ([facility])    marijuana   Gall stones    GERD (gastroesophageal reflux disease)    History of chlamydia 01/04/2011   Hypercholesterolemia    Morbid obesity ([facility])    Smoker    Thoracic spine pain 08/24/2013   Review of Systems: Review of Systems  Constitutional:  Negative for chills, fever and weight loss.  Gastrointestinal:  Negative for abdominal pain, blood in stool, diarrhea, heartburn, melena, nausea and vomiting.  Musculoskeletal:  Positive for back pain, joint pain (R knee), myalgias and neck pain. Negative for falls.  Neurological:  Positive for tingling, sensory change and headaches. Negative for dizziness and focal weakness.   Physical Exam:  Vitals:   02/10/21 1448  BP: (!) 313/82  Pulse: 71  Temp: 98.3 F (36.8 C)  TempSrc: Oral  SpO2: 97%  Weight: 284 lb 8 oz (129 kg)  Height: 5\' 2"  (1.575 m)   Physical Exam Vitals and nursing note reviewed.  Constitutional:      Appearance: She is morbidly obese.  HENT:     Head: Normocephalic and atraumatic.  Pulmonary:     Effort: Pulmonary effort is normal. No respiratory distress.  Musculoskeletal:     Right knee: No swelling, deformity, effusion, erythema, ecchymosis, lacerations, bony tenderness or crepitus. Normal range of motion. Tenderness (tenderness throughout entire joint line) present. No LCL laxity, MCL laxity, ACL laxity or PCL laxity.  Skin:    General: Skin is warm and dry.  Neurological:     Mental  Status: She is alert and oriented to person, place, and time.     Gait: Gait normal.  Psychiatric:        Mood and Affect: Mood normal.        Behavior: Behavior normal.        Thought Content: Thought content normal.        Judgment: Judgment normal.    Assessment & Plan:   See Encounters Tab for problem based charting.  Patient discussed with Dr. 

## 2021-02-10 NOTE — Assessment & Plan Note (Signed)
Wt Readings from Last 3 Encounters:  02/10/21 284 lb 8 oz (129 kg)  01/02/21 280 lb 8 oz (127.2 kg)  11/19/20 290 lb 4.8 oz (131.7 kg)   Vanessa Donaldson endorses significant anxiety regarding her chronic pain and is trying to understand why she is having so much of it.  We discussed that her weight is playing a big role and it is extremely important that she work on weight loss to have improvement in her overall pain levels.  She expressed understanding but states that she is unable to exercise due to this pain.  I suggested water aerobics as an option for her and she is interested in this.  We also discussed medication options.  Vanessa Donaldson states she had a previous that response to phentermine with an approximate 70 pound weight loss however she regained this weight after an unexpected pregnancy.  She would be interested in doing this again if possible.   Assessment/plan: No contraindications to phentermine use other than it is quite cost prohibitive.  Medicaid will not cover this medication for her.  A combination pill of phentermine and Topamax will be approximately $200 a month, however will be much more affordable of split.  PDMP reviewed and she was previously on 37.5 mg however we will start with 30 mg today and titrate as tolerated.  - Start Topamax 25 mg daily - Start phentermine 30 mg daily - Follow-up between 3 to 4 weeks prior to refill requirement

## 2021-02-10 NOTE — Assessment & Plan Note (Signed)
Vanessa Donaldson endorses continued muscle spasm in the left side of her neck that she feels is secondary to her spinal neck pain.  She has difficulty finding a comfortable position and finds that this ultimately ends up in her having spasms.  Pain was well controlled with Flexeril  Assessment/plan: - Refilled Flexeril.  Plan for short course only

## 2021-02-10 NOTE — Assessment & Plan Note (Signed)
Ms. Gilberto has been following with physical therapy and she does not feel like she has had significant improvement in her pain yet.  Did not receive any calls from neurosurgery to schedule an appointment.  Our office it was able to confirm that the had made her an appointment for October 21 but she did not show.  Ms. Reeder endorses continued bilateral numbness and tingling extending into the arms and hands that is intermittent.  She endorses continued neck pain that feels like a burning sensation.  She is taking gabapentin 300 mg 3 times daily without any improvement in her pain.  She is also taking ibuprofen that she feels helps her pain more.  Assessment/plan: - Neurosurgery referral placed again at the request of [place] neurosurgery office - Increase gabapentin to 600 mg 3 times daily given inadequate response - Ibuprofen 800 mg 3 times daily as needed for short course only - 4 to 6-week follow-up pending neurosurgery evaluation

## 2021-02-12 NOTE — Addendum Note (Signed)
Addended by: Verdene Lennert on: 02/12/2021 09:11 AM   Modules accepted: Orders

## 2021-02-13 ENCOUNTER — Telehealth: Payer: Self-pay

## 2021-02-13 NOTE — Telephone Encounter (Signed)
LVM re: no show appt 02/10/21. Advised regarding attendance policy and upcoming appt on 02/14/21.

## 2021-02-13 NOTE — Progress Notes (Signed)
Internal Medicine Clinic Attending  Case discussed with Dr. Basaraba  At the time of the visit.  We reviewed the resident's history and exam and pertinent patient test results.  I agree with the assessment, diagnosis, and plan of care documented in the resident's note.  

## 2021-02-14 ENCOUNTER — Ambulatory Visit: Payer: Medicaid Other | Admitting: Rehabilitative and Restorative Service Providers"

## 2021-02-26 ENCOUNTER — Ambulatory Visit: Payer: Medicaid Other | Attending: Internal Medicine

## 2021-02-26 ENCOUNTER — Telehealth: Payer: Self-pay

## 2021-02-26 NOTE — Telephone Encounter (Signed)
Called pt re: no show appt. Pt has had multiple no shows and was informed she was being DCed from PT services.

## 2021-05-04 ENCOUNTER — Encounter: Payer: Self-pay | Admitting: Internal Medicine

## 2021-05-19 DIAGNOSIS — M545 Low back pain, unspecified: Secondary | ICD-10-CM | POA: Diagnosis not present

## 2021-05-19 DIAGNOSIS — M25561 Pain in right knee: Secondary | ICD-10-CM | POA: Diagnosis not present

## 2021-05-19 DIAGNOSIS — M542 Cervicalgia: Secondary | ICD-10-CM | POA: Diagnosis not present

## 2021-05-19 DIAGNOSIS — M25562 Pain in left knee: Secondary | ICD-10-CM | POA: Diagnosis not present

## 2021-05-27 DIAGNOSIS — S83241A Other tear of medial meniscus, current injury, right knee, initial encounter: Secondary | ICD-10-CM | POA: Diagnosis not present

## 2021-06-19 DIAGNOSIS — M25561 Pain in right knee: Secondary | ICD-10-CM | POA: Diagnosis not present

## 2021-06-24 DIAGNOSIS — M5412 Radiculopathy, cervical region: Secondary | ICD-10-CM | POA: Diagnosis not present

## 2021-06-24 DIAGNOSIS — M542 Cervicalgia: Secondary | ICD-10-CM | POA: Diagnosis not present

## 2021-07-25 ENCOUNTER — Other Ambulatory Visit: Payer: Self-pay

## 2021-07-25 ENCOUNTER — Emergency Department (HOSPITAL_COMMUNITY): Payer: Medicaid Other

## 2021-07-25 ENCOUNTER — Encounter (HOSPITAL_COMMUNITY): Payer: Self-pay | Admitting: Emergency Medicine

## 2021-07-25 ENCOUNTER — Emergency Department (HOSPITAL_COMMUNITY)
Admission: EM | Admit: 2021-07-25 | Discharge: 2021-07-25 | Disposition: A | Discharge status: Home / Self Care | Payer: Medicaid Other | Attending: Emergency Medicine | Admitting: Emergency Medicine

## 2021-07-25 DIAGNOSIS — M7989 Other specified soft tissue disorders: Secondary | ICD-10-CM | POA: Diagnosis not present

## 2021-07-25 DIAGNOSIS — M542 Cervicalgia: Secondary | ICD-10-CM | POA: Diagnosis not present

## 2021-07-25 DIAGNOSIS — Y92481 Parking lot as the place of occurrence of the external cause: Secondary | ICD-10-CM | POA: Insufficient documentation

## 2021-07-25 DIAGNOSIS — M79631 Pain in right forearm: Secondary | ICD-10-CM | POA: Diagnosis present

## 2021-07-25 DIAGNOSIS — R229 Localized swelling, mass and lump, unspecified: Secondary | ICD-10-CM

## 2021-07-25 MED ORDER — KETOROLAC TROMETHAMINE 60 MG/2ML IM SOLN
30.0000 mg | Freq: Once | INTRAMUSCULAR | Status: AC
  Administered 2021-07-25: 30 mg via INTRAMUSCULAR
  Filled 2021-07-25: qty 2
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
## 2021-07-28 DIAGNOSIS — M542 Cervicalgia: Secondary | ICD-10-CM | POA: Diagnosis not present

## 2021-07-28 DIAGNOSIS — S060X0A Concussion without loss of consciousness, initial encounter: Secondary | ICD-10-CM | POA: Diagnosis not present

## 2021-07-28 DIAGNOSIS — S0990XA Unspecified injury of head, initial encounter: Secondary | ICD-10-CM | POA: Diagnosis not present

## 2021-07-28 DIAGNOSIS — R112 Nausea with vomiting, unspecified: Secondary | ICD-10-CM | POA: Diagnosis not present

## 2021-07-31 ENCOUNTER — Other Ambulatory Visit: Payer: Self-pay | Admitting: Physician Assistant

## 2021-07-31 DIAGNOSIS — S36200A Unspecified injury of head of pancreas, initial encounter: Secondary | ICD-10-CM

## 2021-08-01 ENCOUNTER — Encounter (HOSPITAL_COMMUNITY): Payer: Self-pay | Admitting: Emergency Medicine

## 2021-08-01 ENCOUNTER — Other Ambulatory Visit: Payer: Self-pay

## 2021-08-01 ENCOUNTER — Emergency Department (HOSPITAL_COMMUNITY)
Admission: EM | Admit: 2021-08-01 | Discharge: 2021-08-01 | Disposition: A | Discharge status: Home / Self Care | Payer: Medicaid Other | Attending: Emergency Medicine | Admitting: Emergency Medicine

## 2021-08-01 DIAGNOSIS — F17200 Nicotine dependence, unspecified, uncomplicated: Secondary | ICD-10-CM | POA: Insufficient documentation

## 2021-08-01 DIAGNOSIS — R109 Unspecified abdominal pain: Secondary | ICD-10-CM | POA: Diagnosis not present

## 2021-08-01 DIAGNOSIS — G43009 Migraine without aura, not intractable, without status migrainosus: Secondary | ICD-10-CM | POA: Diagnosis not present

## 2021-08-01 DIAGNOSIS — R519 Headache, unspecified: Secondary | ICD-10-CM | POA: Diagnosis present

## 2021-08-01 LAB — COMPREHENSIVE METABOLIC PANEL
ALT: 9 U/L (ref 0–44)
AST: 14 U/L — ABNORMAL LOW (ref 15–41)
Albumin: 3.4 g/dL — ABNORMAL LOW (ref 3.5–5.0)
Alkaline Phosphatase: 46 U/L (ref 38–126)
Anion gap: 6 (ref 5–15)
BUN: 9 mg/dL (ref 6–20)
CO2: 24 mmol/L (ref 22–32)
Calcium: 9.2 mg/dL (ref 8.9–10.3)
Chloride: 109 mmol/L (ref 98–111)
Creatinine, Ser: 0.9 mg/dL (ref 0.44–1.00)
GFR, Estimated: >60 mL/min (ref >60)
Glucose, Bld: 87 mg/dL (ref 70–99)
Potassium: 3.9 mmol/L (ref 3.5–5.1)
Sodium: 139 mmol/L (ref 135–145)
Total Bilirubin: 0.8 mg/dL (ref 0.3–1.2)
Total Protein: 6.4 g/dL — ABNORMAL LOW (ref 6.5–8.1)

## 2021-08-01 LAB — CBC WITH DIFFERENTIAL/PLATELET
Abs Immature Granulocytes: 0.02 10*3/uL (ref 0.00–0.07)
Basophils Absolute: 0 10*3/uL (ref 0.0–0.1)
Basophils Relative: 1 %
Eosinophils Absolute: 0.1 10*3/uL (ref 0.0–0.5)
Eosinophils Relative: 2 %
HCT: 40.6 % (ref 36.0–46.0)
Hemoglobin: 13.8 g/dL (ref 12.0–15.0)
Immature Granulocytes: 0 %
Lymphocytes Relative: 31 %
Lymphs Abs: 2.2 10*3/uL (ref 0.7–4.0)
MCH: 30.1 pg (ref 26.0–34.0)
MCHC: 34 g/dL (ref 30.0–36.0)
MCV: 88.6 fL (ref 80.0–100.0)
Monocytes Absolute: 0.5 10*3/uL (ref 0.1–1.0)
Monocytes Relative: 7 %
Neutro Abs: 4 10*3/uL (ref 1.7–7.7)
Neutrophils Relative %: 59 %
Platelets: 277 10*3/uL (ref 150–400)
RBC: 4.58 MIL/uL (ref 3.87–5.11)
RDW: 13.3 % (ref 11.5–15.5)
WBC: 6.8 10*3/uL (ref 4.0–10.5)
nRBC: 0 % (ref 0.0–0.2)

## 2021-08-01 LAB — I-STAT BETA HCG BLOOD, ED (MC, WL, AP ONLY): I-stat hCG, quantitative: <5 m[IU]/mL (ref <5)

## 2021-08-01 LAB — LIPASE, BLOOD: Lipase: 38 U/L (ref 11–51)

## 2021-08-01 MED ORDER — METOCLOPRAMIDE HCL 5 MG/ML IJ SOLN
10.0000 mg | Freq: Once | INTRAMUSCULAR | Status: AC
  Administered 2021-08-01: 10 mg via INTRAVENOUS
  Filled 2021-08-01: qty 2

## 2021-08-01 MED ORDER — DIPHENHYDRAMINE HCL 25 MG PO CAPS
50.0000 mg | ORAL_CAPSULE | Freq: Once | ORAL | Status: AC
  Administered 2021-08-01: 50 mg via ORAL
  Filled 2021-08-01: qty 2

## 2021-08-01 MED ORDER — LACTATED RINGERS IV BOLUS
1000.0000 mL | Freq: Once | INTRAVENOUS | Status: AC
  Administered 2021-08-01: 1000 mL via INTRAVENOUS

## 2021-08-01 NOTE — Discharge Instructions (Signed)
Been evaluated for headache.  We believe this is most likely benign type of headache such as migraine or cluster.  Return to the emergency department for very severe headache or if you start having neck stiffness or fever.  Please follow-up with your primary care provider as needed. ?
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
## 2021-08-01 NOTE — ED Notes (Addendum)
Patient discharged by MD but left before receiving discharge paperwork as well as taking out her own IV without staff present. Vitals were not able to be completed due to pt leaving before staff were present in room.  ?

## 2021-08-01 NOTE — ED Triage Notes (Signed)
C/o intermittent headache x 1 week with nausea and vomiting.  Taking Advil, Aleve, and Naproxen without relief.  Denies dizziness.  No arm drift. ?

## 2021-08-03 ENCOUNTER — Other Ambulatory Visit: Payer: Self-pay | Admitting: Physician Assistant

## 2021-08-03 ENCOUNTER — Ambulatory Visit
Admission: RE | Admit: 2021-08-03 | Discharge: 2021-08-03 | Disposition: A | Discharge status: Home / Self Care | Payer: Medicaid Other | Source: Ambulatory Visit | Attending: Physician Assistant | Admitting: Physician Assistant

## 2021-08-03 DIAGNOSIS — S0990XA Unspecified injury of head, initial encounter: Secondary | ICD-10-CM

## 2021-08-03 DIAGNOSIS — S36200A Unspecified injury of head of pancreas, initial encounter: Secondary | ICD-10-CM

## 2021-08-05 ENCOUNTER — Other Ambulatory Visit: Payer: Self-pay | Admitting: Internal Medicine

## 2021-08-05 DIAGNOSIS — L304 Erythema intertrigo: Secondary | ICD-10-CM

## 2021-08-11 DIAGNOSIS — M5412 Radiculopathy, cervical region: Secondary | ICD-10-CM | POA: Diagnosis not present

## 2021-08-11 DIAGNOSIS — S0990XA Unspecified injury of head, initial encounter: Secondary | ICD-10-CM | POA: Diagnosis not present

## 2021-08-11 DIAGNOSIS — M1711 Unilateral primary osteoarthritis, right knee: Secondary | ICD-10-CM | POA: Diagnosis not present

## 2021-08-11 DIAGNOSIS — R112 Nausea with vomiting, unspecified: Secondary | ICD-10-CM | POA: Diagnosis not present

## 2021-08-11 DIAGNOSIS — M542 Cervicalgia: Secondary | ICD-10-CM | POA: Diagnosis not present

## 2021-08-11 DIAGNOSIS — S060X0A Concussion without loss of consciousness, initial encounter: Secondary | ICD-10-CM | POA: Diagnosis not present

## 2021-08-18 DIAGNOSIS — M542 Cervicalgia: Secondary | ICD-10-CM | POA: Diagnosis not present

## 2021-08-18 DIAGNOSIS — M4722 Other spondylosis with radiculopathy, cervical region: Secondary | ICD-10-CM | POA: Diagnosis not present

## 2021-08-18 DIAGNOSIS — Z6841 Body Mass Index (BMI) 40.0 and over, adult: Secondary | ICD-10-CM | POA: Diagnosis not present

## 2021-09-24 ENCOUNTER — Other Ambulatory Visit: Payer: Self-pay | Admitting: Internal Medicine

## 2021-09-24 DIAGNOSIS — D1730 Benign lipomatous neoplasm of skin and subcutaneous tissue of unspecified sites: Secondary | ICD-10-CM | POA: Diagnosis not present

## 2021-09-24 DIAGNOSIS — E669 Obesity, unspecified: Secondary | ICD-10-CM | POA: Diagnosis not present

## 2021-09-24 DIAGNOSIS — L304 Erythema intertrigo: Secondary | ICD-10-CM

## 2021-09-24 DIAGNOSIS — M5412 Radiculopathy, cervical region: Secondary | ICD-10-CM | POA: Diagnosis not present

## 2021-09-24 DIAGNOSIS — M542 Cervicalgia: Secondary | ICD-10-CM | POA: Diagnosis not present

## 2021-09-24 DIAGNOSIS — M25561 Pain in right knee: Secondary | ICD-10-CM | POA: Diagnosis not present

## 2021-09-25 ENCOUNTER — Other Ambulatory Visit: Payer: Self-pay | Admitting: Physician Assistant

## 2021-09-25 DIAGNOSIS — M5412 Radiculopathy, cervical region: Secondary | ICD-10-CM

## 2021-09-25 NOTE — Telephone Encounter (Signed)
Not a Clinic patient.  Wa seen 1 time in 01/2021.  No follow up or other past visits.

## 2021-09-25 NOTE — Telephone Encounter (Signed)
Lotramin 1% is OTC so no Rx is needed.  Looks like Dr Erlinda Hong was deleted as her PCP, will forward to [facility] to see if she is one of our patients

## 2021-10-06 ENCOUNTER — Ambulatory Visit
Admission: RE | Admit: 2021-10-06 | Discharge: 2021-10-06 | Disposition: A | Discharge status: Home / Self Care | Payer: Medicaid Other | Source: Ambulatory Visit | Attending: Physician Assistant | Admitting: Physician Assistant

## 2021-10-06 DIAGNOSIS — M47812 Spondylosis without myelopathy or radiculopathy, cervical region: Secondary | ICD-10-CM | POA: Diagnosis not present

## 2021-10-06 DIAGNOSIS — M5412 Radiculopathy, cervical region: Secondary | ICD-10-CM | POA: Diagnosis not present

## 2021-10-06 DIAGNOSIS — M4802 Spinal stenosis, cervical region: Secondary | ICD-10-CM | POA: Diagnosis not present

## 2021-10-06 DIAGNOSIS — M542 Cervicalgia: Secondary | ICD-10-CM | POA: Diagnosis not present

## 2021-10-06 DIAGNOSIS — R200 Anesthesia of skin: Secondary | ICD-10-CM | POA: Diagnosis not present

## 2021-10-16 ENCOUNTER — Other Ambulatory Visit: Payer: Self-pay | Admitting: Internal Medicine

## 2021-10-16 DIAGNOSIS — L304 Erythema intertrigo: Secondary | ICD-10-CM

## 2021-10-16 DIAGNOSIS — M25562 Pain in left knee: Secondary | ICD-10-CM | POA: Diagnosis not present

## 2021-10-16 DIAGNOSIS — M5030 Other cervical disc degeneration, unspecified cervical region: Secondary | ICD-10-CM | POA: Diagnosis not present

## 2021-10-16 DIAGNOSIS — M25561 Pain in right knee: Secondary | ICD-10-CM | POA: Diagnosis not present

## 2021-10-22 DIAGNOSIS — E669 Obesity, unspecified: Secondary | ICD-10-CM | POA: Diagnosis not present

## 2021-11-19 DIAGNOSIS — B372 Candidiasis of skin and nail: Secondary | ICD-10-CM | POA: Diagnosis not present

## 2021-11-19 DIAGNOSIS — M542 Cervicalgia: Secondary | ICD-10-CM | POA: Diagnosis not present

## 2021-11-23 ENCOUNTER — Emergency Department (HOSPITAL_COMMUNITY)
Admission: EM | Admit: 2021-11-23 | Discharge: 2021-11-24 | Discharge status: Against Medical Advice | Payer: No Typology Code available for payment source | Attending: Emergency Medicine | Admitting: Emergency Medicine

## 2021-11-23 ENCOUNTER — Emergency Department (HOSPITAL_COMMUNITY): Payer: No Typology Code available for payment source

## 2021-11-23 ENCOUNTER — Other Ambulatory Visit: Payer: Self-pay

## 2021-11-23 ENCOUNTER — Encounter (HOSPITAL_COMMUNITY): Payer: Self-pay | Admitting: Emergency Medicine

## 2021-11-23 DIAGNOSIS — Z5321 Procedure and treatment not carried out due to patient leaving prior to being seen by health care provider: Secondary | ICD-10-CM | POA: Insufficient documentation

## 2021-11-23 DIAGNOSIS — R1030 Lower abdominal pain, unspecified: Secondary | ICD-10-CM | POA: Insufficient documentation

## 2021-11-23 DIAGNOSIS — R519 Headache, unspecified: Secondary | ICD-10-CM | POA: Diagnosis not present

## 2021-11-23 DIAGNOSIS — S0990XA Unspecified injury of head, initial encounter: Secondary | ICD-10-CM | POA: Diagnosis not present

## 2021-11-23 DIAGNOSIS — M545 Low back pain, unspecified: Secondary | ICD-10-CM | POA: Diagnosis not present

## 2021-11-23 DIAGNOSIS — M549 Dorsalgia, unspecified: Secondary | ICD-10-CM | POA: Insufficient documentation

## 2021-11-23 DIAGNOSIS — M79641 Pain in right hand: Secondary | ICD-10-CM | POA: Diagnosis not present

## 2021-11-23 DIAGNOSIS — M25551 Pain in right hip: Secondary | ICD-10-CM | POA: Insufficient documentation

## 2021-11-23 DIAGNOSIS — Y92410 Unspecified street and highway as the place of occurrence of the external cause: Secondary | ICD-10-CM | POA: Diagnosis not present
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
## 2021-11-28 ENCOUNTER — Emergency Department (HOSPITAL_COMMUNITY)
Admission: EM | Admit: 2021-11-28 | Discharge: 2021-11-28 | Disposition: A | Discharge status: Home / Self Care | Payer: Medicaid Other | Attending: Emergency Medicine | Admitting: Emergency Medicine

## 2021-11-28 ENCOUNTER — Encounter (HOSPITAL_COMMUNITY): Payer: Self-pay | Admitting: Emergency Medicine

## 2021-11-28 ENCOUNTER — Other Ambulatory Visit: Payer: Self-pay

## 2021-11-28 DIAGNOSIS — S4991XA Unspecified injury of right shoulder and upper arm, initial encounter: Secondary | ICD-10-CM | POA: Diagnosis present

## 2021-11-28 DIAGNOSIS — Y92410 Unspecified street and highway as the place of occurrence of the external cause: Secondary | ICD-10-CM | POA: Insufficient documentation

## 2021-11-28 DIAGNOSIS — M5020 Other cervical disc displacement, unspecified cervical region: Secondary | ICD-10-CM | POA: Diagnosis not present

## 2021-11-28 DIAGNOSIS — S46811A Strain of other muscles, fascia and tendons at shoulder and upper arm level, right arm, initial encounter: Secondary | ICD-10-CM | POA: Diagnosis not present

## 2021-11-28 DIAGNOSIS — R519 Headache, unspecified: Secondary | ICD-10-CM | POA: Insufficient documentation

## 2021-11-28 DIAGNOSIS — M25561 Pain in right knee: Secondary | ICD-10-CM | POA: Diagnosis not present

## 2021-11-28 MED ORDER — KETOROLAC TROMETHAMINE 15 MG/ML IJ SOLN
15.0000 mg | Freq: Once | INTRAMUSCULAR | Status: AC
Start: 2021-11-28 — End: 2021-11-28
  Administered 2021-11-28: 15 mg via INTRAMUSCULAR
  Filled 2021-11-28: qty 1

## 2021-11-28 NOTE — Progress Notes (Signed)
Orthopedic Tech Progress Note Patient Details:  Vanessa Donaldson [birth year] [id number]  Ortho Devices Type of Ortho Device: Shoulder immobilizer Ortho Device/Splint Location: rue Ortho Device/Splint Interventions: Ordered, Application, Adjustment   Post Interventions Patient Tolerated: Well  Al Decant 11/28/2021, 5:23 AM
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
## 2021-11-28 NOTE — Discharge Instructions (Signed)
Take 4 over the counter ibuprofen tablets 3 times a day or 2 over-the-counter naproxen tablets twice a day for pain. Also take tylenol 1000mg (2 extra strength) four times a day.    The sling is for comfort only you should take it out of the sling at least 4 times a day and perform range of motion exercises with your shoulder.
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
## 2021-12-29 ENCOUNTER — Ambulatory Visit: Payer: Medicaid Other | Attending: Orthopedic Surgery

## 2021-12-29 DIAGNOSIS — M6281 Muscle weakness (generalized): Secondary | ICD-10-CM | POA: Insufficient documentation

## 2021-12-29 DIAGNOSIS — R262 Difficulty in walking, not elsewhere classified: Secondary | ICD-10-CM | POA: Insufficient documentation

## 2021-12-29 DIAGNOSIS — M25561 Pain in right knee: Secondary | ICD-10-CM | POA: Insufficient documentation

## 2021-12-29 DIAGNOSIS — G8929 Other chronic pain: Secondary | ICD-10-CM | POA: Insufficient documentation

## 2022-01-05 ENCOUNTER — Ambulatory Visit: Payer: Medicaid Other | Admitting: Physical Therapy

## 2022-01-13 ENCOUNTER — Telehealth: Payer: Self-pay

## 2022-01-13 ENCOUNTER — Ambulatory Visit: Payer: Medicaid Other | Admitting: Physical Therapy
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
## 2022-01-21 ENCOUNTER — Encounter: Payer: Self-pay | Admitting: Physical Therapy

## 2022-01-21 ENCOUNTER — Ambulatory Visit: Payer: Medicaid Other | Admitting: Physical Therapy

## 2022-01-21 ENCOUNTER — Other Ambulatory Visit: Payer: Self-pay

## 2022-01-21 DIAGNOSIS — R262 Difficulty in walking, not elsewhere classified: Secondary | ICD-10-CM | POA: Diagnosis not present

## 2022-01-21 DIAGNOSIS — M6281 Muscle weakness (generalized): Secondary | ICD-10-CM | POA: Diagnosis not present

## 2022-01-21 DIAGNOSIS — M25561 Pain in right knee: Secondary | ICD-10-CM | POA: Diagnosis not present

## 2022-01-21 DIAGNOSIS — G8929 Other chronic pain: Secondary | ICD-10-CM | POA: Diagnosis not present

## 2022-01-21 NOTE — Patient Instructions (Signed)
Access Code: [email or web address] URL: [email or web address] Date: 01/21/2022 Prepared by: Voncille Lo IONTOPHORESIS PATIENT PRECAUTIONS & CONTRAINDICATIONS:  Redness under one or both electrodes can occur.  This characterized by a uniform redness that usually disappears within 12 hours of treatment. Small pinhead size blisters may result in response to the drug.  Contact your physician if the problem persists more than 24 hours. On rare occasions, iontophoresis therapy can result in temporary skin reactions such as rash, inflammation, irritation or burns.  The skin reactions may be the result of individual sensitivity to the ionic solution used, the condition of the skin at the start of treatment, reaction to the materials in the electrodes, allergies or sensitivity to dexamethasone, or a poor connection between the patch and your skin.  Discontinue using iontophoresis if you have any of these reactions and report to your therapist. Remove the Patch or electrodes if you have any undue sensation of pain or burning during the treatment and report discomfort to your therapist. Tell your Therapist if you have had known adverse reactions to the application of electrical current. If using the Patch, the LED light will turn off when treatment is complete and the patch can be removed.  Approximate treatment time is 1-3 hours.  Remove the patch when light goes off or after 6 hours. The Patch can be worn during normal activity, however excessive motion where the electrodes have been placed can cause poor contact between the skin and the electrode or uneven electrical current resulting in greater risk of skin irritation. Keep out of the reach of children.   DO NOT use if you have a cardiac pacemaker or any other electrically sensitive implanted device. DO NOT use if you have a known sensitivity to dexamethasone. DO NOT use during Magnetic Resonance Imaging (MRI). DO NOT use over broken or  compromised skin (e.g. sunburn, cuts, or acne) due to the increased risk of skin reaction. DO NOT SHAVE over the area to be treated:  To establish good contact between the Patch and the skin, excessive hair may be clipped. DO NOT place the Patch or electrodes on or over your eyes, directly over your heart, or brain. DO NOT reuse the Patch or electrodes as this may cause burns to occur.  Exercises - Supine ITB Stretch with Strap  - 1 x daily - 7 x weekly - 3 sets - 10 reps - ITB Stretch at [place] 1 x daily - 7 x weekly - 3 sets - 10 reps     Voncille Lo, PT, [facility] Certified Exercise Expert for the Aging Adult  01/21/22 9:24 AM Phone: [phone] Fax: [phone]

## 2022-02-07 DIAGNOSIS — B372 Candidiasis of skin and nail: Secondary | ICD-10-CM | POA: Diagnosis not present

## 2022-02-07 DIAGNOSIS — M25561 Pain in right knee: Secondary | ICD-10-CM | POA: Diagnosis not present

## 2022-03-11 DIAGNOSIS — M25519 Pain in unspecified shoulder: Secondary | ICD-10-CM | POA: Diagnosis not present

## 2022-03-11 DIAGNOSIS — M25561 Pain in right knee: Secondary | ICD-10-CM | POA: Diagnosis not present

## 2022-04-16 DIAGNOSIS — M25561 Pain in right knee: Secondary | ICD-10-CM | POA: Diagnosis not present

## 2022-04-27 DIAGNOSIS — M25561 Pain in right knee: Secondary | ICD-10-CM | POA: Diagnosis not present

## 2022-04-27 DIAGNOSIS — D1730 Benign lipomatous neoplasm of skin and subcutaneous tissue of unspecified sites: Secondary | ICD-10-CM | POA: Diagnosis not present

## 2022-05-19 ENCOUNTER — Ambulatory Visit: Payer: Medicaid Other | Admitting: Physical Therapy

## 2022-05-19 NOTE — Therapy (Incomplete)
OUTPATIENT PHYSICAL THERAPY LOWER EXTREMITY EVALUATION   Patient Name: Vanessa Donaldson MRN: [id number] DOB:1980/12/24, 42 y.o., female Today's Date: 05/19/2022  END OF SESSION:   Past Medical History:  Diagnosis Date   Chlamydia    Chronic back pain    Chronic neck pain    Complication of anesthesia    perceived problem with epidural   Depression    Drug abuse ([place])    marijuana   Gall stones    GERD (gastroesophageal reflux disease)    History of chlamydia 01/04/2011   Hypercholesterolemia    Morbid obesity ([place])    Smoker    Thoracic spine pain 08/24/2013   Past Surgical History:  Procedure Laterality Date   BTL     CHOLECYSTECTOMY N/A 11/20/2012   Procedure: LAPAROSCOPIC CHOLECYSTECTOMY WITH INTRAOPERATIVE CHOLANGIOGRAM;  Surgeon: Pedro Earls, MD;  Location: [facility];  Service: General;  Laterality: N/A;   RECTAL EXAM UNDER ANESTHESIA  11/20/2012   Procedure: RECTAL EXAM UNDER ANESTHESIA;  Surgeon: Pedro Earls, MD;  Location: [facility];  Service: General;;   TUBAL LIGATION     had a baby since [place]   Patient Active Problem List   Diagnosis Date Noted   Cervical spinal stenosis 01/02/2021   Floaters in visual field 01/02/2021   Moderate episode of recurrent major depressive disorder ([place]) 11/19/2020   Muscle strain 11/12/2020   Orthopnea 08/12/2020   Soft tissue mass 08/12/2020   Mixed incontinence 08/12/2020   Right knee pain 08/08/2020   S/P laparoscopic cholecystectomy August 2014 11/20/2012   Active smoker 01/04/2011   Morbid obesity ([place]) 01/04/2011    PCP: no PCP in chart  REFERRING PROVIDER: Shanon Rosser, PA-C  REFERRING DIAG: M25.561 (ICD-10-CM) - Pain in right knee  THERAPY DIAG:  No diagnosis found.  Rationale for Evaluation and Treatment: Rehabilitation  ONSET DATE: ***  SUBJECTIVE:   SUBJECTIVE STATEMENT: ***  PERTINENT HISTORY: Chronic neck/back pain, depression, GERD, obesity PAIN:  Are you having pain: *** Location/description:  *** Best-worst over past week: ***  Per eval -  - aggravating factors: *** - Easing factors: ***    PRECAUTIONS: {Therapy precautions:24002}  WEIGHT BEARING RESTRICTIONS: {Yes ***/No:24003}  FALLS:  Has patient fallen in last 6 months? {fallsyesno:27318}   LIVING ENVIRONMENT: Lives with: lives with their family Lives in: House/apartment Stairs: No Has following equipment at home: None   OCCUPATION: unemployed but trained as CNA   PLOF: Independent  PATIENT GOALS: ***  NEXT MD VISIT: ***  OBJECTIVE:   DIAGNOSTIC FINDINGS:  R knee XR Nov 2022  IMPRESSION: 1. No acute bony abnormality. 2. Early degenerative changes of the medial compartment.  PATIENT SURVEYS:  LEFS ***  COGNITION: Overall cognitive status: Within functional limits for tasks assessed     SENSATION: {sensation:27233}  EDEMA:  {edema:24020}  MUSCLE LENGTH: Hamstrings: Right *** deg; Left *** deg Marcello Moores test: Right *** deg; Left *** deg  POSTURE: {posture:25561}  PALPATION: ***  LOWER EXTREMITY ROM:     {AROM/PROM:27142}  Right eval Left eval  Hip flexion    Hip extension    Hip internal rotation    Hip external rotation    (Blank rows = not tested) (Key: WFL = within functional limits not formally assessed, * = concordant pain, s = stiffness/stretching sensation, NT = not tested)  Comments:    LOWER EXTREMITY MMT:    MMT Right eval Left eval  Hip flexion    Hip abduction (modified sitting)    Hip internal  rotation    Hip external rotation    Knee flexion    Knee extension     (Blank rows = not tested) (Key: WFL = within functional limits not formally assessed, * = concordant pain, s = stiffness/stretching sensation, NT = not tested)  Comments:    LOWER EXTREMITY SPECIAL TESTS:  {LEspecialtests:26242}  FUNCTIONAL TESTS:  {Functional tests:24029}  GAIT: Distance walked: within clinic Assistive device utilized: {Assistive devices:23999} Level of assistance: {Levels  of assistance:24026} Comments: ***   TODAY'S TREATMENT:                                                                                                                              [facility] Adult PT Treatment:                                                DATE: 05/19/22 Therapeutic Exercise: *** Manual Therapy: *** Neuromuscular re-ed: *** Therapeutic Activity: *** Modalities: *** Self Care: ***  PATIENT EDUCATION:  Education details: Pt education on PT impairments, prognosis, and POC. Informed consent. Rationale for interventions, safe/appropriate HEP performance Person educated: Patient Education method: Explanation, Demonstration, Tactile cues, Verbal cues, and Handouts Education comprehension: verbalized understanding, returned demonstration, verbal cues required, tactile cues required, and needs further education    HOME EXERCISE PROGRAM: ***  ASSESSMENT:  CLINICAL IMPRESSION: Patient is a 42 y.o. woman who was seen today for physical therapy evaluation and treatment for chronic R knee pain.   OBJECTIVE IMPAIRMENTS: {opptimpairments:25111}.   ACTIVITY LIMITATIONS: {activitylimitations:27494}  PARTICIPATION LIMITATIONS: {participationrestrictions:25113}  PERSONAL FACTORS: {Personal factors:25162} are also affecting patient's functional outcome.   REHAB POTENTIAL: {rehabpotential:25112}  CLINICAL DECISION MAKING: {clinical decision making:25114}  EVALUATION COMPLEXITY: {Evaluation complexity:25115}   GOALS: Goals reviewed with patient? {yes/no:20286}  SHORT TERM GOALS: Target date: ***  Pt will demonstrate appropriate understanding and performance of initially prescribed HEP in order to facilitate improved independence with management of symptoms.  Baseline: HEP provided on eval Goal status: INITIAL   2. Pt will score greater than or equal to *** on FOTO in order to demonstrate improved perception of function due to symptoms.  Baseline: ***  Goal status:  INITIAL    LONG TERM GOALS: Target date:  {follow up:25551}  Pt will score *** or greater on FOTO in order to demonstrate improved perception of function due to symptoms.  Baseline: *** Goal status: INITIAL  2.  Pt will demonstrate at least *** degrees of *** AROM in order to facilitate improved tolerance to functional movements such as ***.  Baseline: see ROM chart above Goal status: INITIAL  3.  Pt will be able to lift up to *** in order to demonstrate improved capacity for daily activities such as ***.  Baseline: *** Goal status: INITIAL  4.  Pt will be able to perform 5xSTS in  less than or equal to *** in order to demonstrate reduced fall risk and improved functional independence (MCID 5xSTS = 2.3 sec). Baseline: *** Goal status: INITIAL    PLAN:  PT FREQUENCY: {rehab frequency:25116}  PT DURATION: {rehab duration:25117}  PLANNED INTERVENTIONS: {rehab planned interventions:25118::"Therapeutic exercises","Therapeutic activity","Neuromuscular re-education","Balance training","Gait training","Patient/Family education","Self Care","Joint mobilization"}  PLAN FOR NEXT SESSION: Progress ROM/strengthening exercises as able/appropriate, review HEP.    Leeroy Cha PT, DPT 05/19/2022 10:06 AM

## 2022-05-20 ENCOUNTER — Ambulatory Visit: Payer: Medicaid Other | Admitting: Physical Therapy

## 2022-05-20 DIAGNOSIS — Z6841 Body Mass Index (BMI) 40.0 and over, adult: Secondary | ICD-10-CM | POA: Diagnosis not present

## 2022-05-20 DIAGNOSIS — Z0000 Encounter for general adult medical examination without abnormal findings: Secondary | ICD-10-CM | POA: Diagnosis not present

## 2022-05-20 DIAGNOSIS — Z0001 Encounter for general adult medical examination with abnormal findings: Secondary | ICD-10-CM | POA: Diagnosis not present

## 2022-05-20 DIAGNOSIS — M25561 Pain in right knee: Secondary | ICD-10-CM | POA: Diagnosis not present

## 2022-05-20 DIAGNOSIS — N760 Acute vaginitis: Secondary | ICD-10-CM | POA: Diagnosis not present

## 2022-05-20 DIAGNOSIS — Z113 Encounter for screening for infections with a predominantly sexual mode of transmission: Secondary | ICD-10-CM | POA: Diagnosis not present

## 2022-05-20 DIAGNOSIS — Z124 Encounter for screening for malignant neoplasm of cervix: Secondary | ICD-10-CM | POA: Diagnosis not present

## 2022-05-20 NOTE — Therapy (Deleted)
OUTPATIENT PHYSICAL THERAPY LOWER EXTREMITY EVALUATION  Patient Name: Vanessa Donaldson MRN: [id number] DOB:12/02/1980, 42 y.o., female Today's Date: 05/20/2022    Past Medical History:  Diagnosis Date   Chlamydia    Chronic back pain    Chronic neck pain    Complication of anesthesia    perceived problem with epidural   Depression    Drug abuse ([place])    marijuana   Gall stones    GERD (gastroesophageal reflux disease)    History of chlamydia 01/04/2011   Hypercholesterolemia    Morbid obesity ([place])    Smoker    Thoracic spine pain 08/24/2013   Past Surgical History:  Procedure Laterality Date   BTL     CHOLECYSTECTOMY N/A 11/20/2012   Procedure: LAPAROSCOPIC CHOLECYSTECTOMY WITH INTRAOPERATIVE CHOLANGIOGRAM;  Surgeon: Pedro Earls, MD;  [facility]: [facility];  Service: General;  Laterality: N/A;   RECTAL EXAM UNDER ANESTHESIA  11/20/2012   Procedure: RECTAL EXAM UNDER ANESTHESIA;  Surgeon: Pedro Earls, MD;  Location: [facility];  Service: General;;   TUBAL LIGATION     had a baby since [place]   Patient Active Problem List   Diagnosis Date Noted   Cervical spinal stenosis 01/02/2021   Floaters in visual field 01/02/2021   Moderate episode of recurrent major depressive disorder ([place]) 11/19/2020   Muscle strain 11/12/2020   Orthopnea 08/12/2020   Soft tissue mass 08/12/2020   Mixed incontinence 08/12/2020   Right knee pain 08/08/2020   S/P laparoscopic cholecystectomy August 2014 11/20/2012   Active smoker 01/04/2011   Morbid obesity ([place]) 01/04/2011    PCP: Pcp, No  REFERRING PROVIDER: Shanon Rosser, PA-C  THERAPY DIAG:  No diagnosis found.  REFERRING DIAG: Pain in right knee [M25.561]   Rationale for Evaluation and Treatment:  Rehabilitation  SUBJECTIVE:  PERTINENT PAST HISTORY:  Low back pain        PRECAUTIONS: None  WEIGHT BEARING RESTRICTIONS No  FALLS:  Has patient fallen in last 6 months? {yes/no:20286}, Number of falls: ***  MOI/History of  condition:  Onset date: ***  SUBJECTIVE STATEMENT  Vanessa Donaldson is a 42 y.o. female who presents to clinic with chief complaint of ***.  ***  From referring provider:   "***"   Red flags:  {has/denies:26543} {kerredflag:26542}  Pain:  Are you having pain? {yes/no:20286} Pain location: *** NPRS scale:  {NUMBERS; 0-10:5044}/10 to {NUMBERS; 0-10:5044}/10 Aggravating factors: *** Relieving factors: *** Pain description: {PAIN DESCRIPTION:21022940} Stage: {Desc; acute/subacute/chronic:13799} Stability: {kerbetterworse:26715} 24 hour pattern: ***   Occupation: ***  Assistive Device: ***  Hand Dominance: ***  Patient Goals/Specific Activities: ***   OBJECTIVE:   DIAGNOSTIC FINDINGS:  ***   GENERAL OBSERVATION/GAIT:   ***  SENSATION:  Light touch: {intact/deficits:24005}  PALPATION: ***  MUSCLE LENGTH: Hamstrings: Right {kerminsig:27227} restriction; Left {kerminsig:27227} restriction ASLR: Right {keraslr:27228}; Left {keraslr:27228} Marcello Moores test: Right {kerminsig:27227} restriction; Left {kerminsig:27227} restriction Ely's test: Right {kerminsig:27227} restriction; Left {kerminsig:27227} restriction  LE MMT:  MMT Right 05/20/2022 Left 05/20/2022  Hip flexion (L2, L3) *** ***  Knee extension (L3) *** ***  Knee flexion *** ***  Hip abduction *** ***  Hip extension *** ***  Hip external rotation    Hip internal rotation    Hip adduction    Ankle dorsiflexion (L4)    Ankle plantarflexion (S1)    Ankle inversion    Ankle eversion    Great Toe ext (L5)    Grossly     (Blank rows = not tested, score  listed is out of 5 possible points.  N = WNL, D = diminished, C = clear for gross weakness with myotome testing, * = concordant pain with testing)  LE ROM:  ROM Right 05/20/2022 Left 05/20/2022  Hip flexion    Hip extension    Hip abduction    Hip adduction    Hip internal rotation    Hip external rotation    Knee flexion    Knee extension    Ankle  dorsiflexion    Ankle plantarflexion    Ankle inversion    Ankle eversion     (Blank rows = not tested, N = WNL, * = concordant pain with testing)  Functional Tests  Eval (05/20/2022)    30'' STS: ***x  UE used? ***    30'' step up test (8'' step): L ***x, R***x                                                       LOWER EXTREMITY SPECIAL TESTS:  Knee special tests: {KNEE SPECIAL JI:972170   PATIENT SURVEYS:  {rehab surveys:24030}   TODAY'S TREATMENT: ***   PATIENT EDUCATION:  POC, diagnosis, prognosis, HEP, and outcome measures.  Pt educated via explanation, demonstration, and handout (HEP).  Pt confirms understanding verbally.   HOME EXERCISE PROGRAM: ***  Treatment priorities   Eval (05/20/2022)                                                  ASSESSMENT:  CLINICAL IMPRESSION: Vanessa Donaldson is a 42 y.o. female who presents to clinic with signs and sxs consistent with ***.    OBJECTIVE IMPAIRMENTS: Pain, ***  ACTIVITY LIMITATIONS: ***  PERSONAL FACTORS: See medical history and pertinent history   REHAB POTENTIAL: {rehabpotential:25112}  CLINICAL DECISION MAKING: {clinical decision making:25114}  EVALUATION COMPLEXITY: {Evaluation complexity:25115}   GOALS:   SHORT TERM GOALS: Target date: 06/17/2022  Vanessa Donaldson will be >75% HEP compliant to improve carryover between sessions and facilitate independent management of condition  Evaluation (05/20/2022): ongoing Goal status: INITIAL   LONG TERM GOALS: Target date: 07/15/2022  Vanessa Donaldson will show a >/= *** pt improvement in their KOOS Jr. score (MCID is 15 pts) as a proxy for functional improvement   Evaluation/Baseline (05/20/2022): *** pts Goal status: INITIAL   2.  Vanessa Donaldson will self report >/= 50% decrease in pain from evaluation   Evaluation/Baseline (05/20/2022): ***/10 max pain Goal status: INITIAL   3.  Vanessa Donaldson will be able to ***, not limited by pain  Evaluation/Baseline (05/20/2022):  limited Goal status: INITIAL   4.  ***   5.  ***   6.  ***   PLAN: PT FREQUENCY: 1-2x/week  PT DURATION: 8 weeks (Ending 07/15/2022)  PLANNED INTERVENTIONS: Therapeutic exercises, Aquatic therapy, Therapeutic activity, Neuro Muscular re-education, Gait training, Patient/Family education, Joint mobilization, Dry Needling, Electrical stimulation, Spinal mobilization and/or manipulation, Moist heat, Taping, Vasopneumatic device, Ionotophoresis 50m/ml Dexamethasone, and Manual therapy  PLAN FOR NEXT SESSION: ***   KShearon BaloPT, DPT 05/20/2022, 8:00 AM  I just finished a MCD eval/recert.  Name: DROYALTI DEWATERS MRN: [id number]{kermcd1:25763}  Check all conditions that are expected to impact treatment: {Conditions expected  to impact treatment:28273}   Check all possible CPT codes: 97110- Therapeutic Exercise, [phone]- Neuro Re-education, [phone] - Gait Training, [phone] - Manual Therapy, 97530 - Therapeutic Activities, [id number] - Self Care, [phone] - Re-evaluation, [id number] - Mechanical traction, and [id number] - Aquatic therapy   Thank you!  MCD - Secure

## 2022-06-01 ENCOUNTER — Encounter: Payer: Self-pay | Admitting: Plastic Surgery

## 2022-06-01 ENCOUNTER — Ambulatory Visit: Payer: Medicaid Other | Admitting: Plastic Surgery

## 2022-06-01 VITALS — BP 114/81 | HR 99 | Ht 62.0 in | Wt 266.0 lb

## 2022-06-01 DIAGNOSIS — M546 Pain in thoracic spine: Secondary | ICD-10-CM

## 2022-06-01 DIAGNOSIS — M4802 Spinal stenosis, cervical region: Secondary | ICD-10-CM | POA: Diagnosis not present

## 2022-06-01 DIAGNOSIS — Z9049 Acquired absence of other specified parts of digestive tract: Secondary | ICD-10-CM

## 2022-06-01 DIAGNOSIS — R21 Rash and other nonspecific skin eruption: Secondary | ICD-10-CM

## 2022-06-01 DIAGNOSIS — F17210 Nicotine dependence, cigarettes, uncomplicated: Secondary | ICD-10-CM

## 2022-06-01 DIAGNOSIS — N62 Hypertrophy of breast: Secondary | ICD-10-CM

## 2022-06-01 DIAGNOSIS — Z6841 Body Mass Index (BMI) 40.0 and over, adult: Secondary | ICD-10-CM | POA: Diagnosis not present

## 2022-06-01 DIAGNOSIS — M549 Dorsalgia, unspecified: Secondary | ICD-10-CM | POA: Insufficient documentation

## 2022-06-01 DIAGNOSIS — F331 Major depressive disorder, recurrent, moderate: Secondary | ICD-10-CM

## 2022-06-01 DIAGNOSIS — M25561 Pain in right knee: Secondary | ICD-10-CM

## 2022-06-01 DIAGNOSIS — G8929 Other chronic pain: Secondary | ICD-10-CM
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
## 2022-06-02 ENCOUNTER — Other Ambulatory Visit: Payer: Self-pay

## 2022-06-02 ENCOUNTER — Ambulatory Visit: Payer: Medicaid Other | Attending: Physician Assistant | Admitting: Physical Therapy

## 2022-06-02 ENCOUNTER — Encounter: Payer: Self-pay | Admitting: Physical Therapy

## 2022-06-02 DIAGNOSIS — R262 Difficulty in walking, not elsewhere classified: Secondary | ICD-10-CM | POA: Insufficient documentation

## 2022-06-02 DIAGNOSIS — M25561 Pain in right knee: Secondary | ICD-10-CM | POA: Insufficient documentation

## 2022-06-02 DIAGNOSIS — M6281 Muscle weakness (generalized): Secondary | ICD-10-CM | POA: Diagnosis not present

## 2022-06-02 DIAGNOSIS — G8929 Other chronic pain: Secondary | ICD-10-CM | POA: Diagnosis not present

## 2022-06-02 DIAGNOSIS — M5440 Lumbago with sciatica, unspecified side: Secondary | ICD-10-CM | POA: Insufficient documentation
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
## 2022-06-03 ENCOUNTER — Telehealth: Payer: Self-pay

## 2022-06-04 NOTE — Telephone Encounter (Signed)
Erroneous encounter

## 2022-06-09 ENCOUNTER — Ambulatory Visit: Payer: Medicaid Other | Admitting: Physical Therapy
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
## 2022-06-15 ENCOUNTER — Encounter: Payer: Self-pay | Admitting: Physical Therapy

## 2022-06-15 ENCOUNTER — Ambulatory Visit: Payer: Medicaid Other | Admitting: Physical Therapy

## 2022-06-15 DIAGNOSIS — M5440 Lumbago with sciatica, unspecified side: Secondary | ICD-10-CM | POA: Diagnosis not present

## 2022-06-15 DIAGNOSIS — G8929 Other chronic pain: Secondary | ICD-10-CM | POA: Diagnosis not present

## 2022-06-15 DIAGNOSIS — R262 Difficulty in walking, not elsewhere classified: Secondary | ICD-10-CM

## 2022-06-15 DIAGNOSIS — M25561 Pain in right knee: Secondary | ICD-10-CM | POA: Diagnosis not present

## 2022-06-15 DIAGNOSIS — M6281 Muscle weakness (generalized): Secondary | ICD-10-CM | POA: Diagnosis not present

## 2022-06-15 NOTE — Patient Instructions (Signed)

## 2022-06-24 DIAGNOSIS — G8929 Other chronic pain: Secondary | ICD-10-CM | POA: Diagnosis not present

## 2022-06-24 DIAGNOSIS — N760 Acute vaginitis: Secondary | ICD-10-CM | POA: Diagnosis not present

## 2022-06-24 DIAGNOSIS — D171 Benign lipomatous neoplasm of skin and subcutaneous tissue of trunk: Secondary | ICD-10-CM | POA: Diagnosis not present
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
## 2022-07-01 ENCOUNTER — Ambulatory Visit: Payer: Medicaid Other | Attending: Physician Assistant | Admitting: Physical Therapy

## 2022-07-01 ENCOUNTER — Encounter: Payer: Self-pay | Admitting: Physical Therapy

## 2022-07-01 DIAGNOSIS — M5440 Lumbago with sciatica, unspecified side: Secondary | ICD-10-CM | POA: Insufficient documentation

## 2022-07-01 DIAGNOSIS — G8929 Other chronic pain: Secondary | ICD-10-CM | POA: Diagnosis not present

## 2022-07-01 DIAGNOSIS — R262 Difficulty in walking, not elsewhere classified: Secondary | ICD-10-CM | POA: Insufficient documentation

## 2022-07-01 DIAGNOSIS — M6281 Muscle weakness (generalized): Secondary | ICD-10-CM | POA: Insufficient documentation

## 2022-07-01 DIAGNOSIS — M25561 Pain in right knee: Secondary | ICD-10-CM | POA: Diagnosis not present

## 2022-07-08 ENCOUNTER — Ambulatory Visit: Payer: Medicaid Other | Admitting: Physical Therapy
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
## 2022-07-14 ENCOUNTER — Encounter: Payer: Self-pay | Admitting: Physical Therapy

## 2022-07-14 ENCOUNTER — Ambulatory Visit: Payer: Medicaid Other | Admitting: Physical Therapy

## 2022-07-14 DIAGNOSIS — G8929 Other chronic pain: Secondary | ICD-10-CM

## 2022-07-14 DIAGNOSIS — M5440 Lumbago with sciatica, unspecified side: Secondary | ICD-10-CM | POA: Diagnosis not present

## 2022-07-14 DIAGNOSIS — M6281 Muscle weakness (generalized): Secondary | ICD-10-CM

## 2022-07-14 DIAGNOSIS — M25561 Pain in right knee: Secondary | ICD-10-CM | POA: Diagnosis not present

## 2022-07-14 DIAGNOSIS — R262 Difficulty in walking, not elsewhere classified: Secondary | ICD-10-CM | POA: Diagnosis not present

## 2022-07-14 DIAGNOSIS — Z803 Family history of malignant neoplasm of breast: Secondary | ICD-10-CM | POA: Diagnosis not present

## 2022-07-22 ENCOUNTER — Ambulatory Visit: Payer: Medicaid Other | Admitting: Physical Therapy

## 2022-07-27 ENCOUNTER — Ambulatory Visit: Payer: Medicaid Other | Admitting: Physical Therapy

## 2022-07-29 ENCOUNTER — Ambulatory Visit: Payer: Medicaid Other | Attending: Physician Assistant | Admitting: Physical Therapy

## 2022-07-29 DIAGNOSIS — N760 Acute vaginitis: Secondary | ICD-10-CM | POA: Diagnosis not present

## 2022-07-29 DIAGNOSIS — G8929 Other chronic pain: Secondary | ICD-10-CM | POA: Diagnosis not present

## 2022-09-21 DIAGNOSIS — B372 Candidiasis of skin and nail: Secondary | ICD-10-CM | POA: Diagnosis not present

## 2022-09-21 DIAGNOSIS — G894 Chronic pain syndrome: Secondary | ICD-10-CM | POA: Diagnosis not present

## 2022-11-02 DIAGNOSIS — M542 Cervicalgia: Secondary | ICD-10-CM | POA: Diagnosis not present

## 2022-11-02 DIAGNOSIS — E669 Obesity, unspecified: Secondary | ICD-10-CM | POA: Diagnosis not present

## 2022-11-30 DIAGNOSIS — M542 Cervicalgia: Secondary | ICD-10-CM | POA: Diagnosis not present

## 2023-01-05 DIAGNOSIS — J069 Acute upper respiratory infection, unspecified: Secondary | ICD-10-CM | POA: Diagnosis not present

## 2023-01-05 DIAGNOSIS — G894 Chronic pain syndrome: Secondary | ICD-10-CM | POA: Diagnosis not present

## 2023-01-05 DIAGNOSIS — B354 Tinea corporis: Secondary | ICD-10-CM | POA: Diagnosis not present

## 2023-01-05 DIAGNOSIS — Z20822 Contact with and (suspected) exposure to covid-19: Secondary | ICD-10-CM | POA: Diagnosis not present

## 2023-01-05 DIAGNOSIS — R6889 Other general symptoms and signs: Secondary | ICD-10-CM | POA: Diagnosis not present

## 2023-02-22 DIAGNOSIS — M5412 Radiculopathy, cervical region: Secondary | ICD-10-CM | POA: Diagnosis not present

## 2023-02-22 DIAGNOSIS — G8929 Other chronic pain: Secondary | ICD-10-CM | POA: Diagnosis not present

## 2023-02-22 DIAGNOSIS — M5416 Radiculopathy, lumbar region: Secondary | ICD-10-CM | POA: Diagnosis not present

## 2023-02-22 DIAGNOSIS — Z09 Encounter for follow-up examination after completed treatment for conditions other than malignant neoplasm: Secondary | ICD-10-CM | POA: Diagnosis not present

## 2023-02-22 DIAGNOSIS — R825 Elevated urine levels of drugs, medicaments and biological substances: Secondary | ICD-10-CM | POA: Diagnosis not present

## 2023-03-24 DIAGNOSIS — M79672 Pain in left foot: Secondary | ICD-10-CM | POA: Diagnosis not present

## 2023-03-24 DIAGNOSIS — M25562 Pain in left knee: Secondary | ICD-10-CM | POA: Diagnosis not present

## 2023-03-24 DIAGNOSIS — J069 Acute upper respiratory infection, unspecified: Secondary | ICD-10-CM | POA: Diagnosis not present

## 2023-03-24 DIAGNOSIS — R0683 Snoring: Secondary | ICD-10-CM | POA: Diagnosis not present

## 2023-03-24 DIAGNOSIS — M25561 Pain in right knee: Secondary | ICD-10-CM | POA: Diagnosis not present

## 2023-03-24 DIAGNOSIS — G894 Chronic pain syndrome: Secondary | ICD-10-CM | POA: Diagnosis not present

## 2023-03-24 DIAGNOSIS — B354 Tinea corporis: Secondary | ICD-10-CM | POA: Diagnosis not present

## 2023-04-11 ENCOUNTER — Encounter: Payer: Self-pay | Admitting: Neurology

## 2023-04-11 ENCOUNTER — Ambulatory Visit: Payer: Medicaid Other | Admitting: Neurology

## 2023-04-26 DIAGNOSIS — M25562 Pain in left knee: Secondary | ICD-10-CM | POA: Diagnosis not present

## 2023-04-26 DIAGNOSIS — G8929 Other chronic pain: Secondary | ICD-10-CM | POA: Diagnosis not present

## 2023-04-26 DIAGNOSIS — M25561 Pain in right knee: Secondary | ICD-10-CM | POA: Diagnosis not present

## 2023-04-26 DIAGNOSIS — M7918 Myalgia, other site: Secondary | ICD-10-CM | POA: Diagnosis not present

## 2023-05-16 DIAGNOSIS — B354 Tinea corporis: Secondary | ICD-10-CM | POA: Diagnosis not present

## 2023-05-16 DIAGNOSIS — M25561 Pain in right knee: Secondary | ICD-10-CM | POA: Diagnosis not present

## 2023-05-17 DIAGNOSIS — S93602A Unspecified sprain of left foot, initial encounter: Secondary | ICD-10-CM | POA: Diagnosis not present

## 2023-05-18 ENCOUNTER — Ambulatory Visit: Payer: Medicaid Other | Admitting: Neurology

## 2023-05-24 ENCOUNTER — Encounter: Payer: Self-pay | Admitting: Neurology

## 2023-05-24 ENCOUNTER — Ambulatory Visit (INDEPENDENT_AMBULATORY_CARE_PROVIDER_SITE_OTHER): Payer: Medicaid Other | Admitting: Neurology

## 2023-05-24 VITALS — BP 122/87 | Ht 62.0 in | Wt 292.0 lb

## 2023-05-24 DIAGNOSIS — Z5321 Procedure and treatment not carried out due to patient leaving prior to being seen by health care provider: Secondary | ICD-10-CM

## 2023-05-25 NOTE — Progress Notes (Signed)
Patient left the office prior to being seen.

## 2023-06-13 DIAGNOSIS — M25561 Pain in right knee: Secondary | ICD-10-CM | POA: Diagnosis not present

## 2023-06-13 DIAGNOSIS — M5441 Lumbago with sciatica, right side: Secondary | ICD-10-CM | POA: Diagnosis not present

## 2023-06-13 DIAGNOSIS — G8929 Other chronic pain: Secondary | ICD-10-CM | POA: Diagnosis not present

## 2023-06-13 DIAGNOSIS — Z0000 Encounter for general adult medical examination without abnormal findings: Secondary | ICD-10-CM | POA: Diagnosis not present

## 2023-07-05 DIAGNOSIS — S93602A Unspecified sprain of left foot, initial encounter: Secondary | ICD-10-CM | POA: Diagnosis not present

## 2023-10-22 ENCOUNTER — Emergency Department (HOSPITAL_COMMUNITY)
Admission: EM | Admit: 2023-10-22 | Discharge: 2023-10-22 | Disposition: A | Discharge status: Home / Self Care | Attending: Emergency Medicine | Admitting: Emergency Medicine

## 2023-10-22 ENCOUNTER — Other Ambulatory Visit: Payer: Self-pay

## 2023-10-22 ENCOUNTER — Emergency Department (HOSPITAL_COMMUNITY)

## 2023-10-22 ENCOUNTER — Encounter (HOSPITAL_COMMUNITY): Payer: Self-pay

## 2023-10-22 DIAGNOSIS — K760 Fatty (change of) liver, not elsewhere classified: Secondary | ICD-10-CM | POA: Diagnosis not present

## 2023-10-22 DIAGNOSIS — Y900 Blood alcohol level of less than 20 mg/100 ml: Secondary | ICD-10-CM | POA: Insufficient documentation

## 2023-10-22 DIAGNOSIS — R531 Weakness: Secondary | ICD-10-CM | POA: Insufficient documentation

## 2023-10-22 DIAGNOSIS — F17200 Nicotine dependence, unspecified, uncomplicated: Secondary | ICD-10-CM | POA: Insufficient documentation

## 2023-10-22 DIAGNOSIS — H538 Other visual disturbances: Secondary | ICD-10-CM | POA: Insufficient documentation

## 2023-10-22 DIAGNOSIS — M542 Cervicalgia: Secondary | ICD-10-CM | POA: Insufficient documentation

## 2023-10-22 DIAGNOSIS — M549 Dorsalgia, unspecified: Secondary | ICD-10-CM | POA: Diagnosis not present

## 2023-10-22 DIAGNOSIS — R109 Unspecified abdominal pain: Secondary | ICD-10-CM | POA: Diagnosis not present

## 2023-10-22 DIAGNOSIS — N854 Malposition of uterus: Secondary | ICD-10-CM | POA: Diagnosis not present

## 2023-10-22 DIAGNOSIS — R9431 Abnormal electrocardiogram [ECG] [EKG]: Secondary | ICD-10-CM | POA: Diagnosis not present

## 2023-10-22 DIAGNOSIS — F1210 Cannabis abuse, uncomplicated: Secondary | ICD-10-CM | POA: Insufficient documentation

## 2023-10-22 DIAGNOSIS — R29818 Other symptoms and signs involving the nervous system: Secondary | ICD-10-CM | POA: Diagnosis not present

## 2023-10-22 DIAGNOSIS — J341 Cyst and mucocele of nose and nasal sinus: Secondary | ICD-10-CM | POA: Diagnosis not present

## 2023-10-22 LAB — URINALYSIS, ROUTINE W REFLEX MICROSCOPIC
Bacteria, UA: NONE SEEN
Bilirubin Urine: NEGATIVE
Glucose, UA: NEGATIVE mg/dL
Hgb urine dipstick: NEGATIVE
Ketones, ur: NEGATIVE mg/dL
Nitrite: NEGATIVE
Protein, ur: NEGATIVE mg/dL
Specific Gravity, Urine: 1.02 (ref 1.005–1.030)
pH: 5 (ref 5.0–8.0)

## 2023-10-22 LAB — CBC
HCT: 42.6 % (ref 36.0–46.0)
HCT: 41 % (ref 36.0–46.0)
Hemoglobin: 14.1 g/dL (ref 12.0–15.0)
Hemoglobin: 13.5 g/dL (ref 12.0–15.0)
MCH: 28.7 pg (ref 26.0–34.0)
MCH: 28.8 pg (ref 26.0–34.0)
MCHC: 33.1 g/dL (ref 30.0–36.0)
MCHC: 32.9 g/dL (ref 30.0–36.0)
MCV: 86.8 fL (ref 80.0–100.0)
MCV: 87.4 fL (ref 80.0–100.0)
Platelets: 299 K/uL (ref 150–400)
Platelets: 261 K/uL (ref 150–400)
RBC: 4.91 MIL/uL (ref 3.87–5.11)
RBC: 4.69 MIL/uL (ref 3.87–5.11)
RDW: 13.8 % (ref 11.5–15.5)
RDW: 13.9 % (ref 11.5–15.5)
WBC: 6.7 K/uL (ref 4.0–10.5)
WBC: 6.3 K/uL (ref 4.0–10.5)
nRBC: 0 % (ref 0.0–0.2)
nRBC: 0 % (ref 0.0–0.2)

## 2023-10-22 LAB — I-STAT CHEM 8, ED
BUN: 6 mg/dL (ref 6–20)
Calcium, Ion: 1.17 mmol/L (ref 1.15–1.40)
Chloride: 107 mmol/L (ref 98–111)
Creatinine, Ser: 0.7 mg/dL (ref 0.44–1.00)
Glucose, Bld: 107 mg/dL — ABNORMAL HIGH (ref 70–99)
HCT: 40 % (ref 36.0–46.0)
Hemoglobin: 13.6 g/dL (ref 12.0–15.0)
Potassium: 3.7 mmol/L (ref 3.5–5.1)
Sodium: 140 mmol/L (ref 135–145)
TCO2: 21 mmol/L — ABNORMAL LOW (ref 22–32)

## 2023-10-22 LAB — LIPASE, BLOOD: Lipase: 29 U/L (ref 11–51)

## 2023-10-22 LAB — COMPREHENSIVE METABOLIC PANEL WITH GFR
ALT: 11 U/L (ref 0–44)
AST: 13 U/L — ABNORMAL LOW (ref 15–41)
Albumin: 3.4 g/dL — ABNORMAL LOW (ref 3.5–5.0)
Alkaline Phosphatase: 49 U/L (ref 38–126)
Anion gap: 8 (ref 5–15)
BUN: 7 mg/dL (ref 6–20)
CO2: 21 mmol/L — ABNORMAL LOW (ref 22–32)
Calcium: 8.8 mg/dL — ABNORMAL LOW (ref 8.9–10.3)
Chloride: 107 mmol/L (ref 98–111)
Creatinine, Ser: 0.7 mg/dL (ref 0.44–1.00)
GFR, Estimated: >60 mL/min (ref >60)
Glucose, Bld: 112 mg/dL — ABNORMAL HIGH (ref 70–99)
Potassium: 3.8 mmol/L (ref 3.5–5.1)
Sodium: 136 mmol/L (ref 135–145)
Total Bilirubin: 1 mg/dL (ref 0.0–1.2)
Total Protein: 6.8 g/dL (ref 6.5–8.1)

## 2023-10-22 LAB — ETHANOL: Alcohol, Ethyl (B): <15 mg/dL (ref <15)

## 2023-10-22 LAB — DIFFERENTIAL
Abs Immature Granulocytes: 0.02 K/uL (ref 0.00–0.07)
Basophils Absolute: 0.1 K/uL (ref 0.0–0.1)
Basophils Relative: 1 %
Eosinophils Absolute: 0.1 K/uL (ref 0.0–0.5)
Eosinophils Relative: 2 %
Immature Granulocytes: 0 %
Lymphocytes Relative: 29 %
Lymphs Abs: 1.9 K/uL (ref 0.7–4.0)
Monocytes Absolute: 0.5 K/uL (ref 0.1–1.0)
Monocytes Relative: 7 %
Neutro Abs: 4 K/uL (ref 1.7–7.7)
Neutrophils Relative %: 61 %

## 2023-10-22 LAB — RAPID URINE DRUG SCREEN, HOSP PERFORMED
Amphetamines: NOT DETECTED
Barbiturates: NOT DETECTED
Benzodiazepines: NOT DETECTED
Cocaine: NOT DETECTED
Opiates: NOT DETECTED
Tetrahydrocannabinol: POSITIVE — AB

## 2023-10-22 LAB — PROTIME-INR
INR: 1 (ref 0.8–1.2)
Prothrombin Time: 13.4 s (ref 11.4–15.2)

## 2023-10-22 LAB — HCG, SERUM, QUALITATIVE: Preg, Serum: NEGATIVE

## 2023-10-22 LAB — APTT: aPTT: 34 s (ref 24–36)

## 2023-10-22 MED ORDER — PREDNISONE 20 MG PO TABS
60.0000 mg | ORAL_TABLET | Freq: Once | ORAL | Status: AC
  Administered 2023-10-22: 60 mg via ORAL
  Filled 2023-10-22: qty 3

## 2023-10-22 MED ORDER — PREDNISONE 10 MG PO TABS: 30.0000 mg | ORAL_TABLET | Freq: Every day | ORAL | 0 refills | Status: AC

## 2023-10-22 MED ORDER — HYDROCODONE-ACETAMINOPHEN 5-325 MG PO TABS
1.0000 | ORAL_TABLET | Freq: Once | ORAL | Status: AC
  Administered 2023-10-22: 1 via ORAL
  Filled 2023-10-22: qty 1

## 2023-10-22 MED ORDER — IOHEXOL 350 MG/ML SOLN
125.0000 mL | Freq: Once | INTRAVENOUS | Status: AC | PRN
  Administered 2023-10-22: 125 mL via INTRAVENOUS

## 2023-10-22 NOTE — ED Notes (Signed)
 Patient transported to CT

## 2023-10-22 NOTE — ED Triage Notes (Signed)
 Pt c.o right sided pain from her neck down to her flank that started suddenly at 5am this morning, woke her out of her sleep. Pt denies n/v, no urinary symptoms.

## 2023-10-22 NOTE — ED Provider Notes (Signed)
 [place] [facility] Provider Note   CSN: [id number] Arrival date & time: 10/22/23  9144     Patient presents with: Flank Pain  HPI Vanessa Donaldson is a 43 y.o. female with chronic back pain and neck pain, current smoker, status postcholecystectomy in 2014 presenting for right-sided neck pain.  She states it started around 5 AM this morning and woke her from sleep.  It is in the right lower lateral neck but she reports she is also had some right sided back pain and right-sided flank pain.  That started a couple weeks prior.  Denies nausea vomiting, urinary symptoms, abnormal vaginal bleeding or discharge.  States she has had blurry vision in both eyes but this has also been going on for couple of months.  She states she is never felt back pain this for and is not sure if it is radiating down to the right side of her body.  Denies trauma.  Still able to move her neck but it is painful when she rotates to the right.  Denies weakness and numbness in her extremities.    Flank Pain       Prior to Admission medications   Medication Sig Start Date End Date Taking? Authorizing Provider  predniSONE  (DELTASONE ) 10 MG tablet Take 3 tablets (30 mg total) by mouth daily for 4 days. 10/22/23 10/26/23 Yes Khyren Hing K, PA-C  oxyCODONE -acetaminophen  (PERCOCET) 10-325 MG tablet Take 1 tablet by mouth every 6 (six) hours as needed for pain.    [provider]  phentermine  30 MG capsule Take 1 capsule (30 mg total) by mouth every morning. 02/10/21   Arnett Saunders, MD    Allergies: Patient has no known allergies.    Review of Systems  Genitourinary:  Positive for flank pain.    Physical Exam   Vitals:   10/22/23 1015 10/22/23 1045  BP: 96/67 110/75  Pulse: 72 73  Resp: 16 20  Temp:    SpO2: 100% 100%    CONSTITUTIONAL:  well-appearing, NAD NEURO:  Alert and oriented x 3, CN 3-12 grossly intact, right arm notably weak in comparison to the left,  symmetric strength in the lower extremities.  Sensation is intact to light touch.  Patient with steady gait. EYES:  eyes equal and reactive ENT/NECK:  Supple, no stridor  CARDIO:  regular rate and rhythm, appears well-perfused  PULM:  No respiratory distress, CTAB GI/GU:  non-distended, soft, non tender MSK/SPINE:  No gross deformities, no edema, moves all extremities  SKIN:  no rash, atraumatic  *Additional and/or pertinent findings included in MDM below  (all labs ordered are listed, but only abnormal results are displayed) Labs Reviewed  COMPREHENSIVE METABOLIC PANEL WITH GFR - Abnormal; Notable for the following components:      Result Value   CO2 21 (*)    Glucose, Bld 112 (*)    Calcium 8.8 (*)    Albumin 3.4 (*)    AST 13 (*)    All other components within normal limits  URINALYSIS, ROUTINE W REFLEX MICROSCOPIC - Abnormal; Notable for the following components:   APPearance HAZY (*)    Leukocytes,Ua TRACE (*)    All other components within normal limits  RAPID URINE DRUG SCREEN, HOSP PERFORMED - Abnormal; Notable for the following components:   Tetrahydrocannabinol POSITIVE (*)    All other components within normal limits  I-STAT CHEM 8, ED - Abnormal; Notable for the following components:   Glucose, Bld 107 (*)  TCO2 21 (*)    All other components within normal limits  LIPASE, BLOOD  CBC  HCG, SERUM, QUALITATIVE  ETHANOL  PROTIME-INR  APTT  DIFFERENTIAL  CBC    EKG: None  Radiology: CT Angio Head Neck W WO CM Result Date: 10/22/2023 CLINICAL DATA:  Neuro deficit, acute, stroke suspected. Right-sided neck pain. EXAM: CT ANGIOGRAPHY HEAD AND NECK WITH AND WITHOUT CONTRAST TECHNIQUE: Multidetector CT imaging of the head and neck was performed using the standard protocol during bolus administration of intravenous contrast. Multiplanar CT image reconstructions and MIPs were obtained to evaluate the vascular anatomy. Carotid stenosis measurements (when applicable)  are obtained utilizing NASCET criteria, using the distal internal carotid diameter as the denominator. RADIATION DOSE REDUCTION: This exam was performed according to the departmental dose-optimization program which includes automated exposure control, adjustment of the mA and/or kV according to patient size and/or use of iterative reconstruction technique. CONTRAST:  OMNIPAQUE  IOHEXOL  350 MG/ML SOLN COMPARISON:  CT head 11/23/2021.  CTA head and neck 09/02/2015. FINDINGS: CT HEAD FINDINGS Brain: There is no evidence of an acute infarct, intracranial hemorrhage, mass, midline shift, or extra-axial fluid collection. Cerebral volume is normal. The ventricles are normal in size. Vascular: No hyperdense vessel. Skull: No fracture or suspicious lesion. Sinuses/Orbits: Mild mucosal thickening in the ethmoid sinuses. Mucous retention cyst in the left maxillary sinus. Clear mastoid air cells. Unremarkable orbits. Other: None. Review of the MIP images confirms the above findings CTA NECK FINDINGS Aortic arch: Standard branching.  Widely patent arch vessel origins. Right carotid system: Patent without evidence of stenosis, dissection, or significant atherosclerosis. Left carotid system: Patent without evidence of stenosis, dissection, or significant atherosclerosis. Vertebral arteries: Limited assessment of the majority of the left V1 segment due to adjacent dense venous contrast and shoulder artifact. Otherwise, both vertebral arteries are patent and codominant without evidence of a significant stenosis or dissection. Skeleton: Multilevel cervical disc degeneration, focally advanced at C3-4 where there is severe disc space narrowing and prominent degenerative endplate sclerosis with uncovertebral spurring resulting in severe right neural foraminal stenosis. Suspected at least mild spinal stenosis at C3-4. Less pronounced disc degeneration at C4-5 and C5-6. Other neck: No evidence of cervical lymphadenopathy or mass.  Upper chest: Clear lung apices. Review of the MIP images confirms the above findings CTA HEAD FINDINGS Anterior circulation: The internal carotid arteries are widely patent from skull base to carotid termini. ACAs and MCAs are patent without evidence of a proximal branch occlusion or significant proximal stenosis. No aneurysm is identified. Posterior circulation: The intracranial vertebral arteries are widely patent to the basilar. Patent AICA and SCA origins are visualized bilaterally. The basilar artery is widely patent. There are small posterior communicating arteries bilaterally. Both PCAs are patent without evidence of a significant proximal stenosis. No aneurysm is identified. Venous sinuses: Patent. Anatomic variants: None. Review of the MIP images confirms the above findings IMPRESSION: 1. Unremarkable noncontrast CT appearance of the brain. 2. No evidence of a large vessel occlusion, significant stenosis, or dissection in the head and neck with assessment of the left V1 segment being limited. 3. Cervical disc degeneration, including severe right neural foraminal stenosis at C3-4. Electronically Signed   By: Dasie Hamburg M.D.   On: 10/22/2023 12:17   CT Angio Chest/Abd/Pel for Dissection W and/or W/WO Result Date: 10/22/2023 CLINICAL DATA:  Right-sided pain radiating to the back. Concern for acute aortic syndrome. EXAM: CT ANGIOGRAPHY CHEST, ABDOMEN AND PELVIS TECHNIQUE: Non-contrast CT of the chest was initially  obtained. Multidetector CT imaging through the chest, abdomen and pelvis was performed using the standard protocol during bolus administration of intravenous contrast. Multiplanar reconstructed images and MIPs were obtained and reviewed to evaluate the vascular anatomy. RADIATION DOSE REDUCTION: This exam was performed according to the departmental dose-optimization program which includes automated exposure control, adjustment of the mA and/or kV according to patient size and/or use of iterative  reconstruction technique. CONTRAST:  OMNIPAQUE  IOHEXOL  350 MG/ML SOLN COMPARISON:  CT abdomen pelvis dated 11/05/2016. FINDINGS: CTA CHEST FINDINGS Cardiovascular: There is no cardiomegaly or pericardial effusion. The thoracic aorta is unremarkable. The origins of the great vessels of the aortic arch are patent. Evaluation of the pulmonary arteries is limited due to respiratory motion. No pulmonary artery embolus identified. Mediastinum/Nodes: No hilar or mediastinal adenopathy. The esophagus and the thyroid  gland are grossly unremarkable. No mediastinal fluid collection. Lungs/Pleura: No focal consolidation, pleural effusion, pneumothorax. The central airways are patent. Musculoskeletal: No acute osseous pathology. Review of the MIP images confirms the above findings. CTA ABDOMEN AND PELVIS FINDINGS VASCULAR Aorta: Normal caliber aorta without aneurysm, dissection, vasculitis or significant stenosis. Celiac: Patent without evidence of aneurysm, dissection, vasculitis or significant stenosis. SMA: Patent without evidence of aneurysm, dissection, vasculitis or significant stenosis. Renals: Both renal arteries are patent without evidence of aneurysm, dissection, vasculitis, fibromuscular dysplasia or significant stenosis. IMA: Patent without evidence of aneurysm, dissection, vasculitis or significant stenosis. Inflow: Patent without evidence of aneurysm, dissection, vasculitis or significant stenosis. Veins: No obvious venous abnormality within the limitations of this arterial phase study. Review of the MIP images confirms the above findings. NON-VASCULAR No intra-abdominal free air or free fluid. Hepatobiliary: Fatty liver. No biliary dilatation. Cholecystectomy. Pancreas: Unremarkable. No pancreatic ductal dilatation or surrounding inflammatory changes. Spleen: Normal in size without focal abnormality. Adrenals/Urinary Tract: The adrenal glands unremarkable. Apparent heterogeneous enhancement of the renal  parenchyma may be artifactual and related to artifact caused by body habitus. Correlation with urinalysis recommended to evaluate for possibility of pyelonephritis. There is no hydronephrosis on either side. The visualized ureters and urinary bladder appear unremarkable Stomach/Bowel: There is no bowel obstruction or active inflammation. The appendix is normal. Lymphatic: No adenopathy. Reproductive: The uterus is anteverted and grossly unremarkable. No suspicious adnexal masses. Other: None Musculoskeletal: No acute osseous pathology. Review of the MIP images confirms the above findings. IMPRESSION: 1. No acute intrathoracic pathology. No aortic aneurysm or dissection. 2. Fatty liver. 3. Artifact versus possible pyelonephritis. Correlation with urinalysis recommended. Electronically Signed   By: Vanetta Chou M.D.   On: 10/22/2023 11:58     Procedures   Medications Ordered in the ED  predniSONE  (DELTASONE ) tablet 60 mg (has no administration in time range)  HYDROcodone -acetaminophen  (NORCO/VICODIN) 5-325 MG per tablet 1 tablet (1 tablet Oral Given 10/22/23 1026)  iohexol  (OMNIPAQUE ) 350 MG/ML injection 125 mL (125 mLs Intravenous Contrast Given 10/22/23 1132)                                    Medical Decision Making Amount and/or Complexity of Data Reviewed Labs: ordered. Radiology: ordered.  Risk Prescription drug management.   43 year old well-appearing female present for neck pain.  Exam notable for reproducible pain about the right lateral neck with right arm weakness.  Per chart review, she has been evaluated for right arm weakness in the past.    DDx includes stroke, multiple sclerosis, musculoskeletal, cervical radiculopathy, aortic dissection, other.  CT scans were unremarkable.  CT angio head and neck though did show severe right sided foraminal stenosis about the level C3 and C4 which could be contributing to her symptoms.  Shared these findings with patient.  Labs were  reassuring.  Started her on a 5-day course of prednisone  and advised her to follow-up with neurosurgery for concern of cervical radiculopathy.  Discussed return precautions.  Discharged good condition.     Final diagnoses:  Neck pain    ED Discharge Orders          Ordered    predniSONE  (DELTASONE ) 10 MG tablet  Daily        10/22/23 1310               Lang Norleen POUR, PA-C 10/22/23 1310    Mannie Pac T, DO 10/24/23 1056

## 2023-10-22 NOTE — Discharge Instructions (Addendum)
 Evaluation today revealed that you have some stenosis in your cervical spine this could be contributing to your neck pain and weakness in your right arm.  Please follow-up with neurosurgery.  If your symptoms worsen in any way please return to the ED for further evaluation.  I am putting you on a 5-day course of steroids.  You received your first dose today in the ED.

## 2023-10-22 NOTE — ED Notes (Signed)
 CCMD called by this RN

## 2023-10-22 NOTE — ED Notes (Signed)
 Awaiting patient from lobby.
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
